# Patient Record
Sex: Female | Born: 1940 | Race: White | Hispanic: No | Marital: Married | Smoking: Never smoker
Health system: Southern US, Community
[De-identification: ages and names within clinical notes are randomized; demographics above are authoritative.]

## PROBLEM LIST (undated history)

## (undated) DIAGNOSIS — E119 Type 2 diabetes mellitus without complications: Secondary | ICD-10-CM

## (undated) DIAGNOSIS — E7800 Pure hypercholesterolemia, unspecified: Secondary | ICD-10-CM

## (undated) DIAGNOSIS — I10 Essential (primary) hypertension: Secondary | ICD-10-CM

## (undated) HISTORY — PX: BREAST BIOPSY: SHX20

---

## 1998-07-06 ENCOUNTER — Ambulatory Visit (HOSPITAL_COMMUNITY): Admission: RE | Admit: 1998-07-06 | Discharge: 1998-07-06 | Payer: Self-pay | Admitting: *Deleted

## 1998-07-06 ENCOUNTER — Encounter: Payer: Self-pay | Admitting: *Deleted

## 1998-09-13 ENCOUNTER — Ambulatory Visit (HOSPITAL_COMMUNITY): Admission: RE | Admit: 1998-09-13 | Discharge: 1998-09-13 | Payer: Self-pay | Admitting: Obstetrics & Gynecology

## 1999-06-26 ENCOUNTER — Encounter: Payer: Self-pay | Admitting: Emergency Medicine

## 1999-06-26 ENCOUNTER — Emergency Department (HOSPITAL_COMMUNITY): Admission: EM | Admit: 1999-06-26 | Discharge: 1999-06-26 | Payer: Self-pay | Admitting: Emergency Medicine

## 1999-06-30 ENCOUNTER — Encounter: Payer: Self-pay | Admitting: Emergency Medicine

## 1999-06-30 ENCOUNTER — Ambulatory Visit (HOSPITAL_COMMUNITY): Admission: RE | Admit: 1999-06-30 | Discharge: 1999-06-30 | Payer: Self-pay | Admitting: Emergency Medicine

## 1999-08-04 ENCOUNTER — Ambulatory Visit (HOSPITAL_COMMUNITY): Admission: RE | Admit: 1999-08-04 | Discharge: 1999-08-04 | Payer: Self-pay | Admitting: Emergency Medicine

## 1999-08-04 ENCOUNTER — Encounter: Payer: Self-pay | Admitting: Emergency Medicine

## 1999-10-09 ENCOUNTER — Ambulatory Visit (HOSPITAL_BASED_OUTPATIENT_CLINIC_OR_DEPARTMENT_OTHER): Admission: RE | Admit: 1999-10-09 | Discharge: 1999-10-09 | Payer: Self-pay | Admitting: Emergency Medicine

## 1999-11-14 ENCOUNTER — Encounter: Admission: RE | Admit: 1999-11-14 | Discharge: 2000-02-12 | Payer: Self-pay | Admitting: Pulmonary Disease

## 1999-12-31 ENCOUNTER — Ambulatory Visit (HOSPITAL_BASED_OUTPATIENT_CLINIC_OR_DEPARTMENT_OTHER): Admission: RE | Admit: 1999-12-31 | Discharge: 1999-12-31 | Payer: Self-pay | Admitting: Pulmonary Disease

## 2000-09-26 ENCOUNTER — Encounter: Payer: Self-pay | Admitting: Emergency Medicine

## 2000-09-26 ENCOUNTER — Ambulatory Visit (HOSPITAL_COMMUNITY): Admission: RE | Admit: 2000-09-26 | Discharge: 2000-09-26 | Payer: Self-pay | Admitting: Emergency Medicine

## 2000-10-01 ENCOUNTER — Encounter: Payer: Self-pay | Admitting: Emergency Medicine

## 2000-10-01 ENCOUNTER — Encounter: Admission: RE | Admit: 2000-10-01 | Discharge: 2000-10-01 | Payer: Self-pay | Admitting: Emergency Medicine

## 2001-04-18 ENCOUNTER — Encounter: Admission: RE | Admit: 2001-04-18 | Discharge: 2001-04-18 | Payer: Self-pay | Admitting: Emergency Medicine

## 2001-04-18 ENCOUNTER — Encounter: Payer: Self-pay | Admitting: Emergency Medicine

## 2001-05-20 ENCOUNTER — Encounter: Admission: RE | Admit: 2001-05-20 | Discharge: 2001-08-18 | Payer: Self-pay | Admitting: Emergency Medicine

## 2001-10-03 ENCOUNTER — Ambulatory Visit (HOSPITAL_COMMUNITY): Admission: RE | Admit: 2001-10-03 | Discharge: 2001-10-03 | Payer: Self-pay | Admitting: Emergency Medicine

## 2001-10-03 ENCOUNTER — Encounter: Payer: Self-pay | Admitting: Emergency Medicine

## 2002-06-11 ENCOUNTER — Encounter: Admission: RE | Admit: 2002-06-11 | Discharge: 2002-06-11 | Payer: Self-pay | Admitting: Emergency Medicine

## 2002-06-11 ENCOUNTER — Encounter: Payer: Self-pay | Admitting: Emergency Medicine

## 2003-04-02 ENCOUNTER — Other Ambulatory Visit: Payer: Self-pay

## 2003-10-09 ENCOUNTER — Encounter: Admission: RE | Admit: 2003-10-09 | Discharge: 2003-10-09 | Payer: Self-pay | Admitting: Emergency Medicine

## 2003-11-20 ENCOUNTER — Ambulatory Visit (HOSPITAL_COMMUNITY): Admission: RE | Admit: 2003-11-20 | Discharge: 2003-11-20 | Payer: Self-pay | Admitting: Emergency Medicine

## 2005-02-24 ENCOUNTER — Ambulatory Visit (HOSPITAL_COMMUNITY): Admission: RE | Admit: 2005-02-24 | Discharge: 2005-02-24 | Payer: Self-pay | Admitting: Gynecology

## 2006-01-16 ENCOUNTER — Ambulatory Visit: Payer: Self-pay | Admitting: Gastroenterology

## 2006-02-27 ENCOUNTER — Ambulatory Visit (HOSPITAL_COMMUNITY): Admission: RE | Admit: 2006-02-27 | Discharge: 2006-02-27 | Payer: Self-pay | Admitting: Family Medicine

## 2007-05-07 ENCOUNTER — Ambulatory Visit (HOSPITAL_COMMUNITY): Admission: RE | Admit: 2007-05-07 | Discharge: 2007-05-07 | Payer: Self-pay | Admitting: Family Medicine

## 2008-03-26 ENCOUNTER — Ambulatory Visit: Payer: Self-pay | Admitting: Family Medicine

## 2008-03-27 ENCOUNTER — Ambulatory Visit: Payer: Self-pay | Admitting: Family Medicine

## 2008-03-27 ENCOUNTER — Inpatient Hospital Stay: Payer: Self-pay | Admitting: Internal Medicine

## 2008-04-14 ENCOUNTER — Ambulatory Visit: Payer: Self-pay | Admitting: General Surgery

## 2008-07-27 ENCOUNTER — Ambulatory Visit (HOSPITAL_COMMUNITY): Admission: RE | Admit: 2008-07-27 | Discharge: 2008-07-27 | Payer: Self-pay | Admitting: Family Medicine

## 2009-03-26 ENCOUNTER — Ambulatory Visit: Payer: Self-pay | Admitting: Family Medicine

## 2009-10-04 IMAGING — CR DG CHEST 1V PORT
1 series · 1 of 1 positions shown · non-contrast
Comparison: none

REASON FOR EXAM: pt on vent, s/p decortication of left pleural effusion
COMMENTS:

[view not recorded]
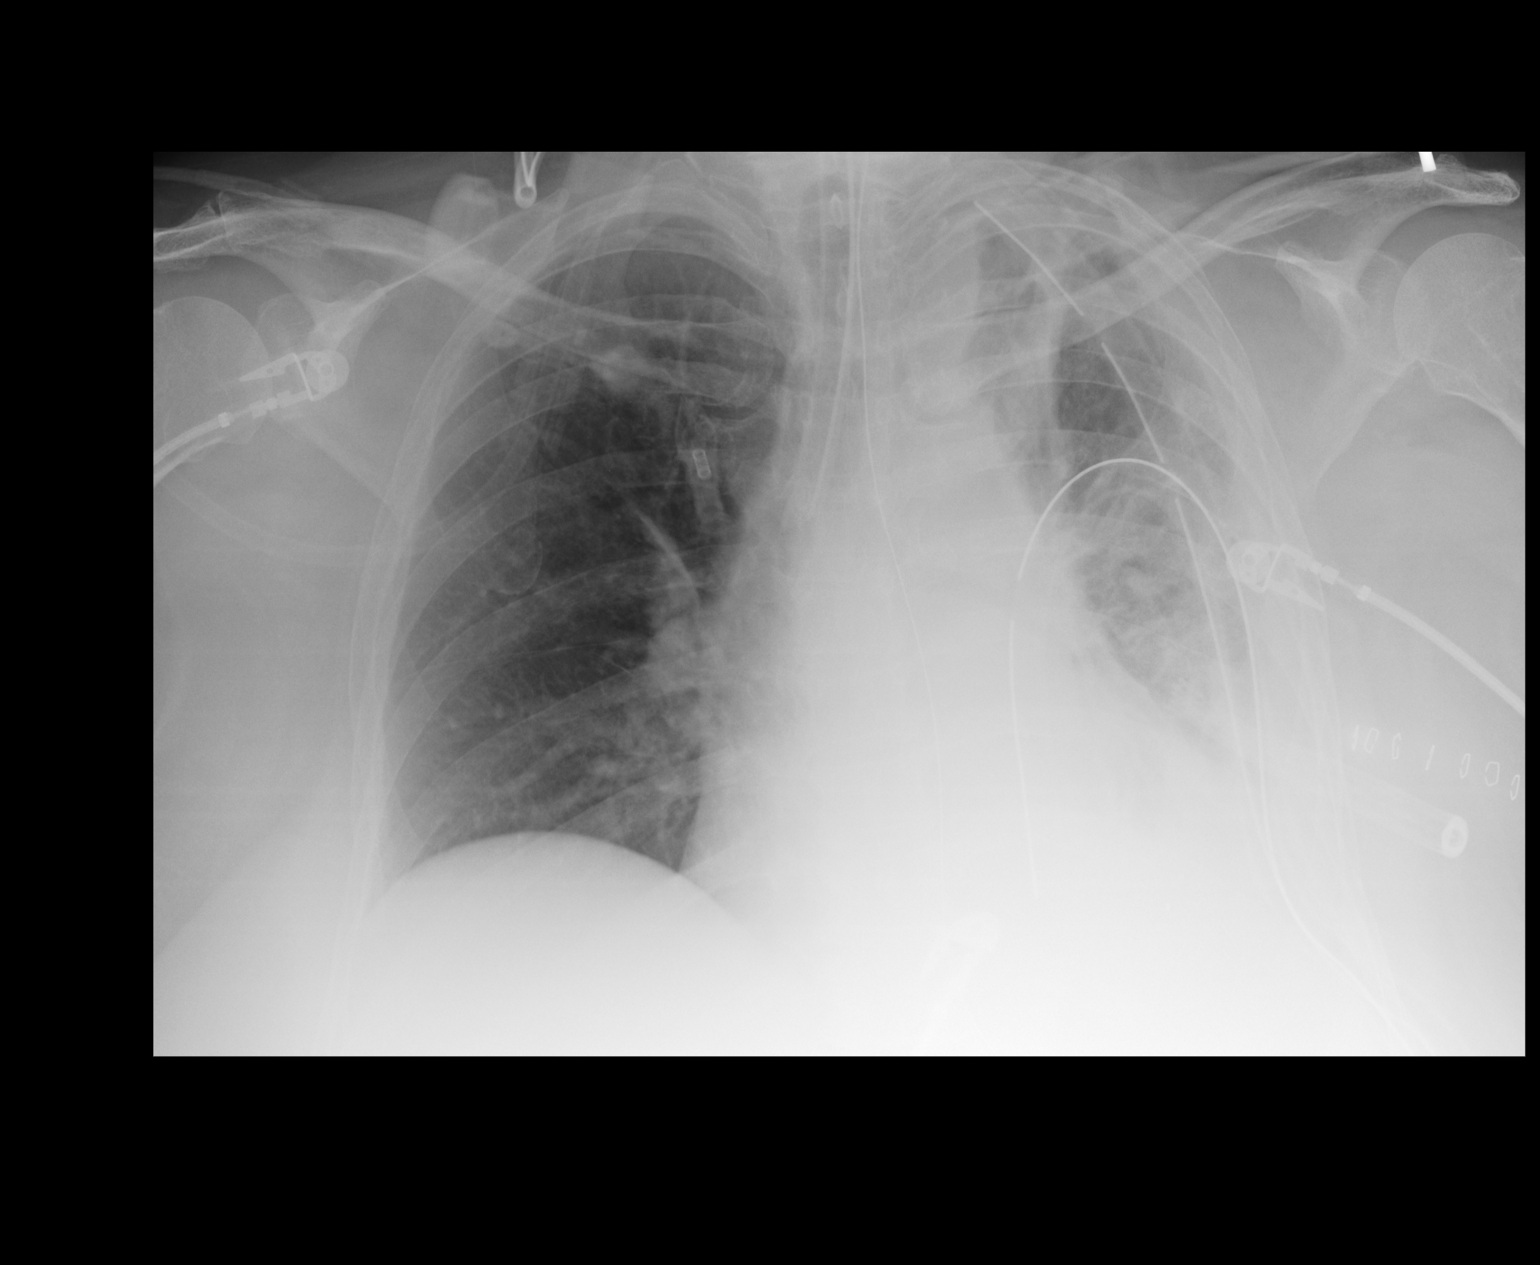

[1 of 1 positions shown; findings below may reference images not displayed]

PROCEDURE:     DXR - DXR PORTABLE CHEST SINGLE VIEW  - April 02, 2008  [DATE]

RESULT:     Comparison is made to the prior exam if 04/01/08. Two chest tubes
are present on the LEFT. No pneumothorax is seen. Mild atelectasis or
fibrosis is noted at the LEFT upper lobe. There is hazy increase in density
throughout the RIGHT lung field, consistent with postsurgical change,
pleural thickening and possibly residual effusion at the LEFT base. The
RIGHT lung field is clear of infiltrate. An endotracheal tube is present
with the tip at the orifice of the RIGHT mainstem bronchus. The endotracheal
tube needs to be withdrawn by a few centimeters. A nasogastric tube is
present.
IMPRESSION: Please see above.

## 2009-10-06 IMAGING — CR DG CHEST 1V PORT
1 series · 1 of 1 positions shown · non-contrast
Comparison: none

REASON FOR EXAM: pleural effusion
COMMENTS:

[view not recorded]
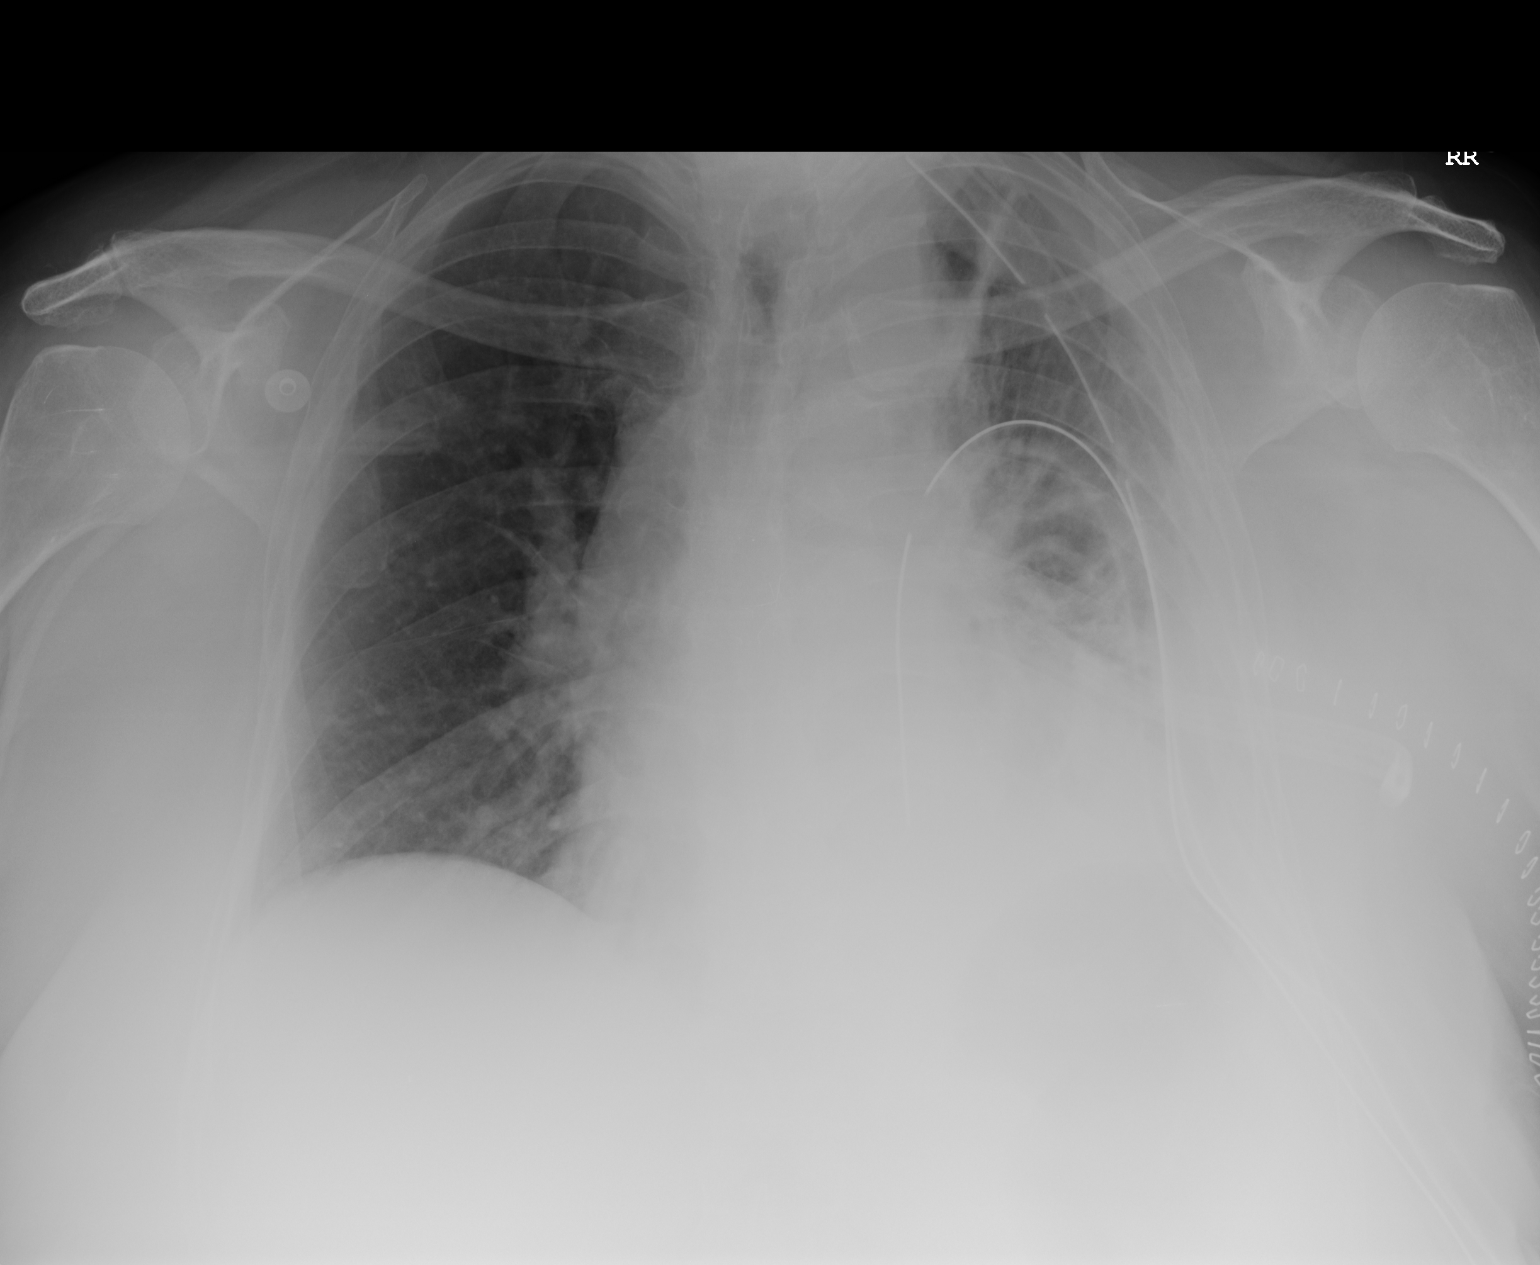

[1 of 1 positions shown; findings below may reference images not displayed]

PROCEDURE:     DXR - DXR PORTABLE CHEST SINGLE VIEW  - April 04, 2008 [DATE]

RESULT:     Two chest tubes are noted on the LEFT. There is no evidence of
pneumothorax.  Better aeration of the LEFT lung is noted from prior day's
exam. Residual pleural effusion and/or atelectasis/infiltrate is noted. The
RIGHT lung is clear.
IMPRESSION: 1. Two chest tubes noted in good anatomic position. Better aeration of the
LEFT lung is noted on today's examination. This suggests removal of pleural
effusion. Residual effusion and/or atelectasis/pneumonia in the LEFT lung
base is present.

2. No other abnormalities are identified. The heart size is prominent. No
evidence of congestive heart failure.

## 2009-10-09 IMAGING — CR DG CHEST 2V
1 series · 2 of 2 positions shown · non-contrast
Comparison: none

REASON FOR EXAM: S/P decortication left lung.
COMMENTS:

[Series 1: view not recorded · 0.17mm/px · 2 of 2 slices shown]
[im 1/2]
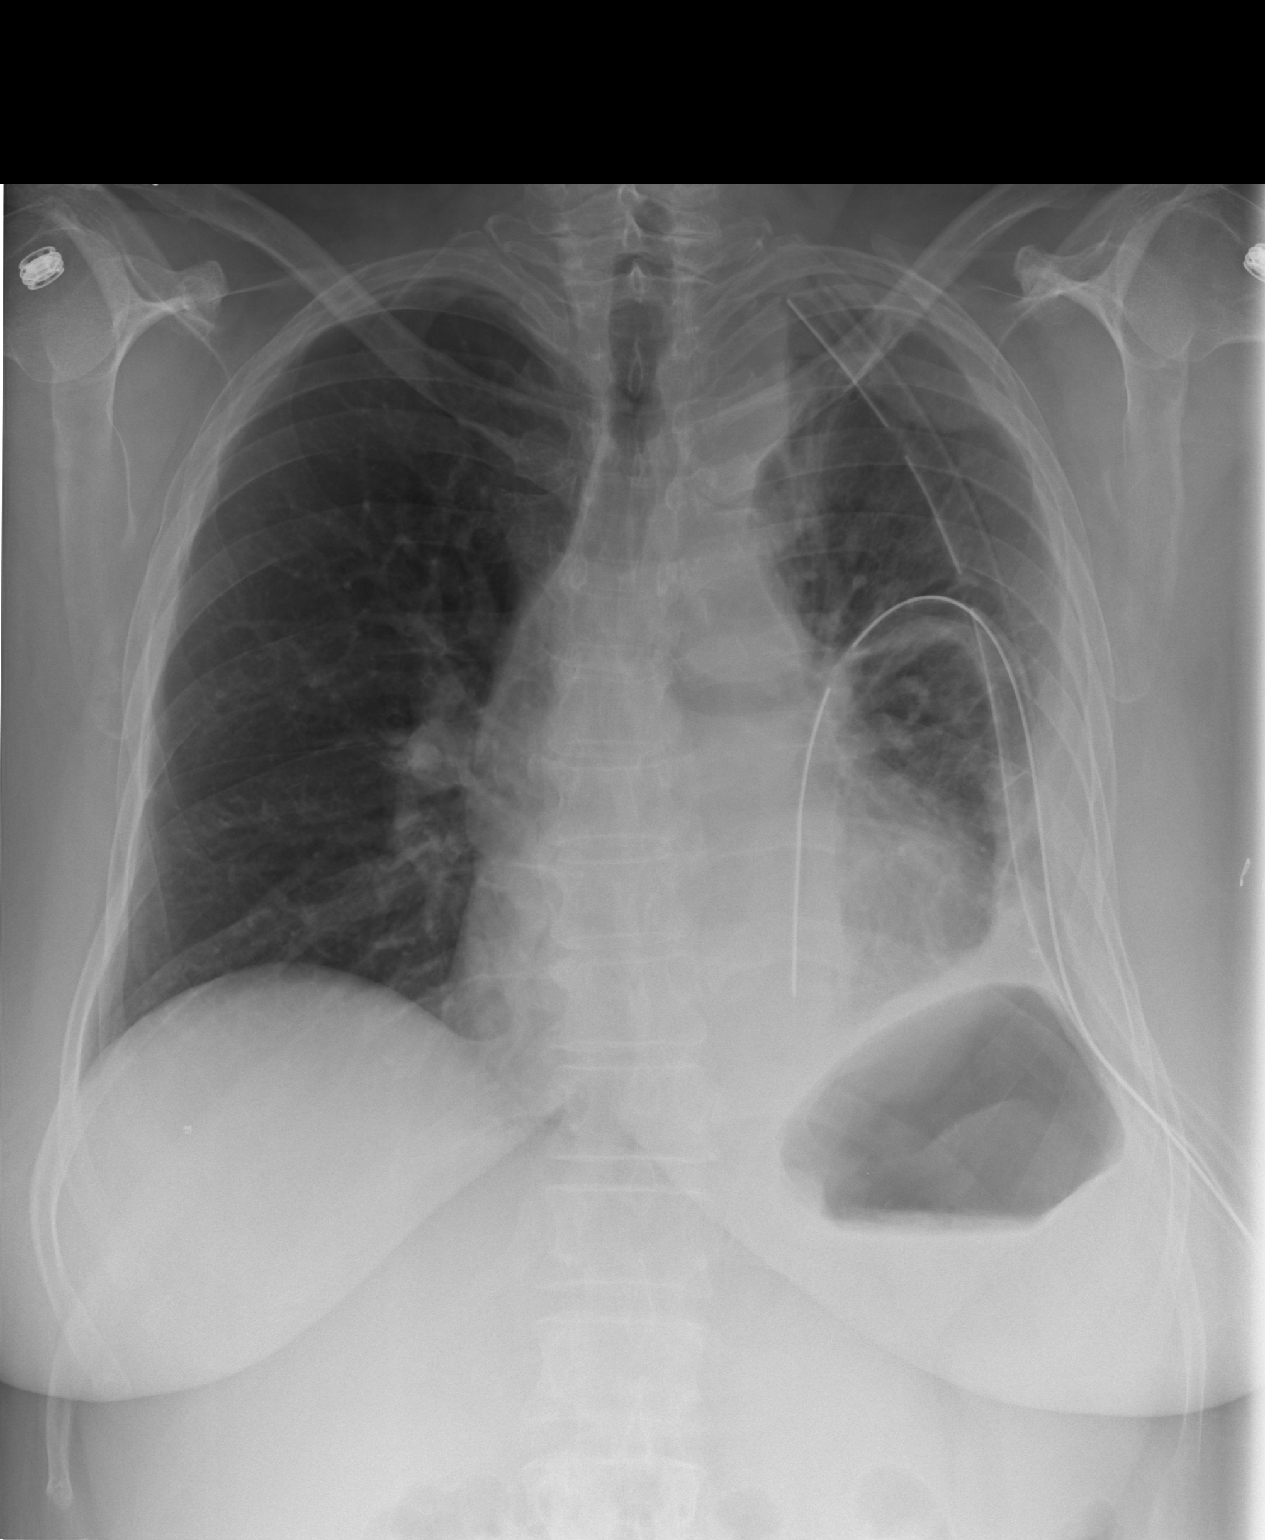
[im 2/2]
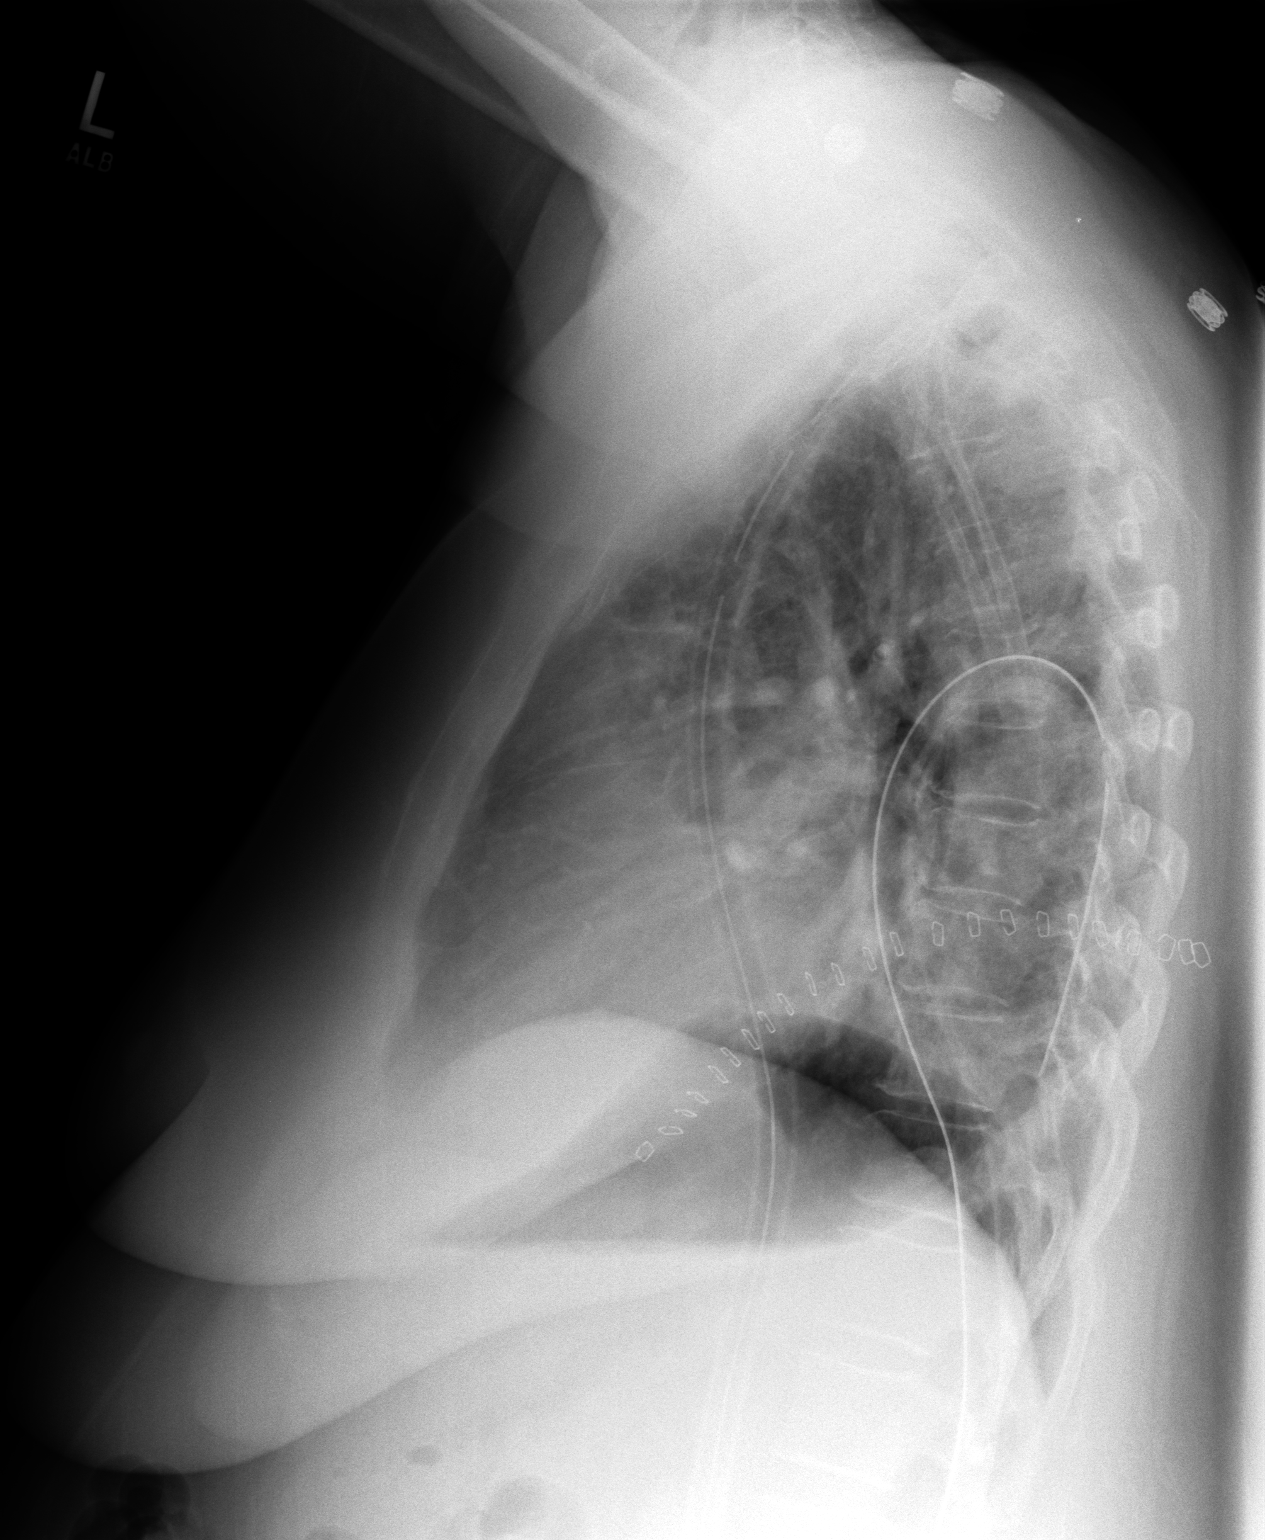

[2 of 2 positions shown; findings below may reference images not displayed]

PROCEDURE:     DXR - DXR CHEST PA (OR AP) AND LATERAL  - April 07, 2008  [DATE]

RESULT:     Comparison is made to study 04 April, 2008.

The two chest tubes remain in place on the LEFT. The LEFT lung is better
aerated today. There remains some density at the LEFT lung base laterally
consistent with fluid or pleural thickening. Pleural thickening in the LEFT
paratracheal region is present and stable. The cardiac silhouette is mildly
enlarged though stable. The RIGHT lung is clear.
IMPRESSION: There is improved appearance of the LEFT lung since the
prior study.

## 2009-10-10 IMAGING — CR DG CHEST 2V
1 series · 2 of 2 positions shown · non-contrast
Comparison: none

REASON FOR EXAM: Effusion
COMMENTS:

[Series 1: view not recorded · 0.17mm/px · 2 of 2 slices shown]
[im 1/2]
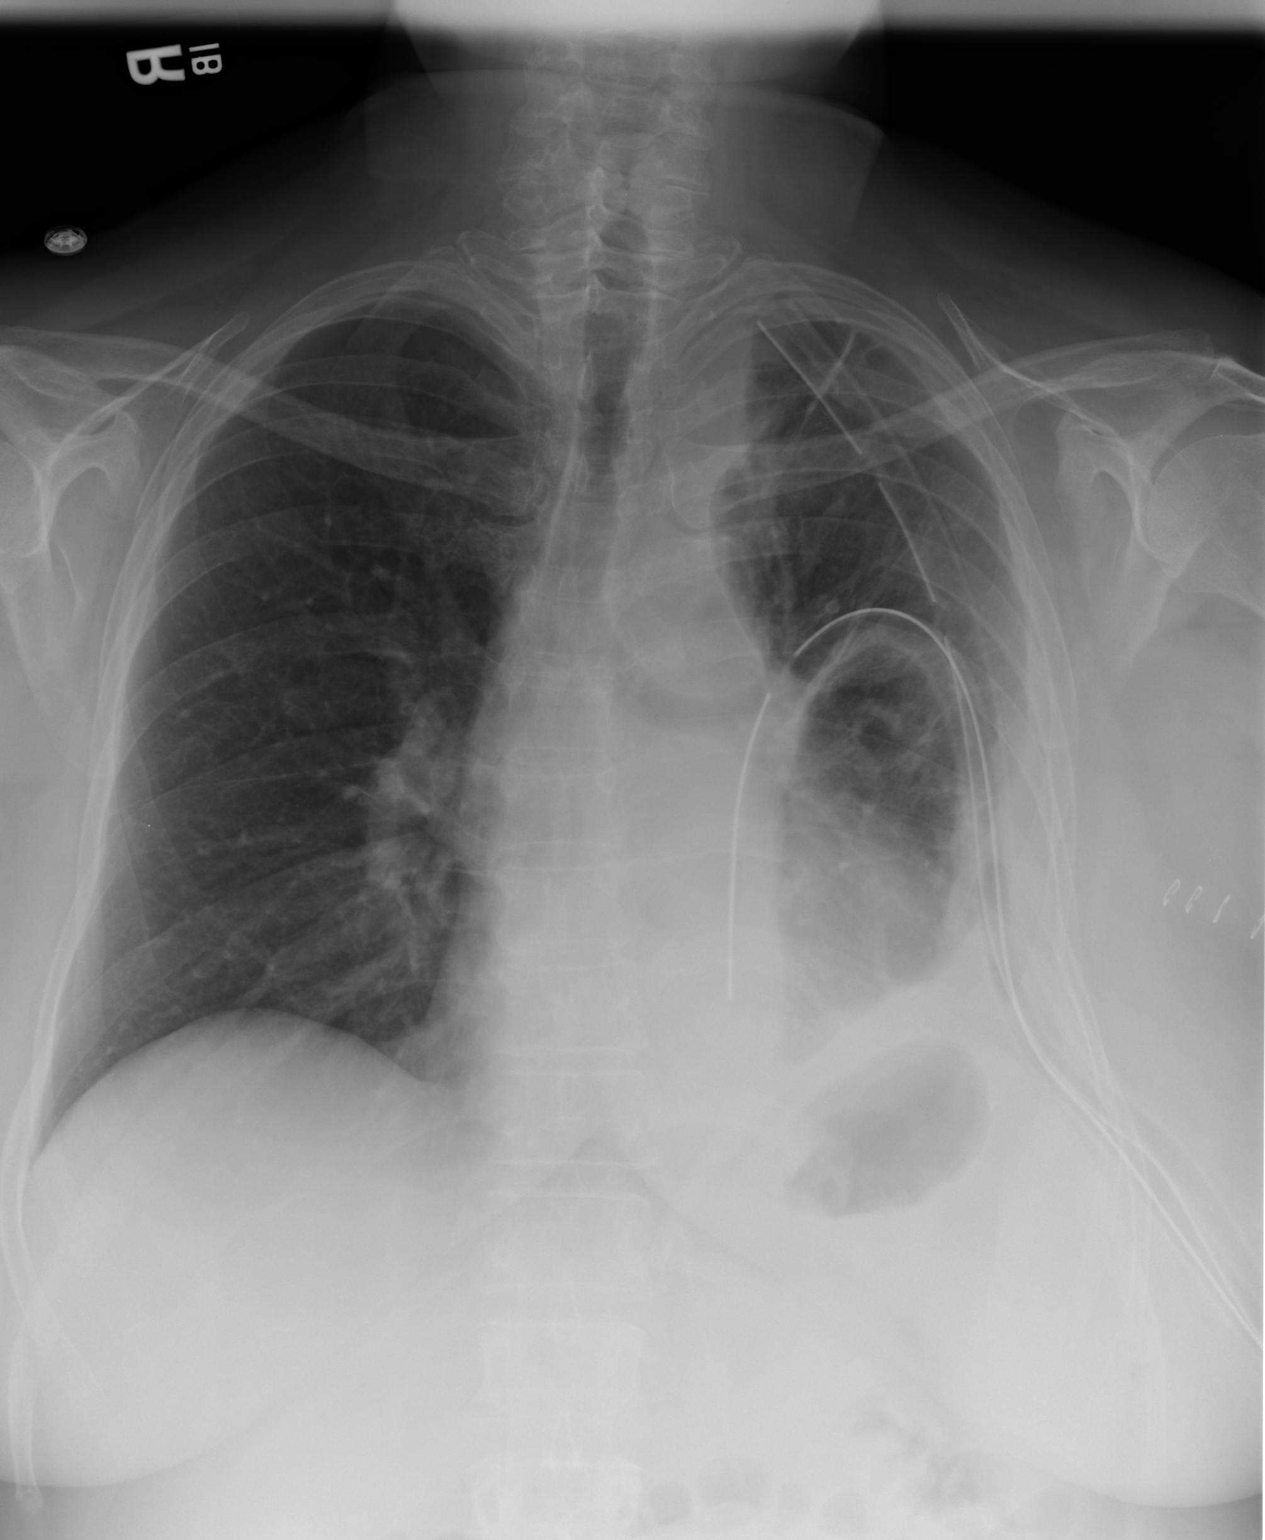
[im 2/2]
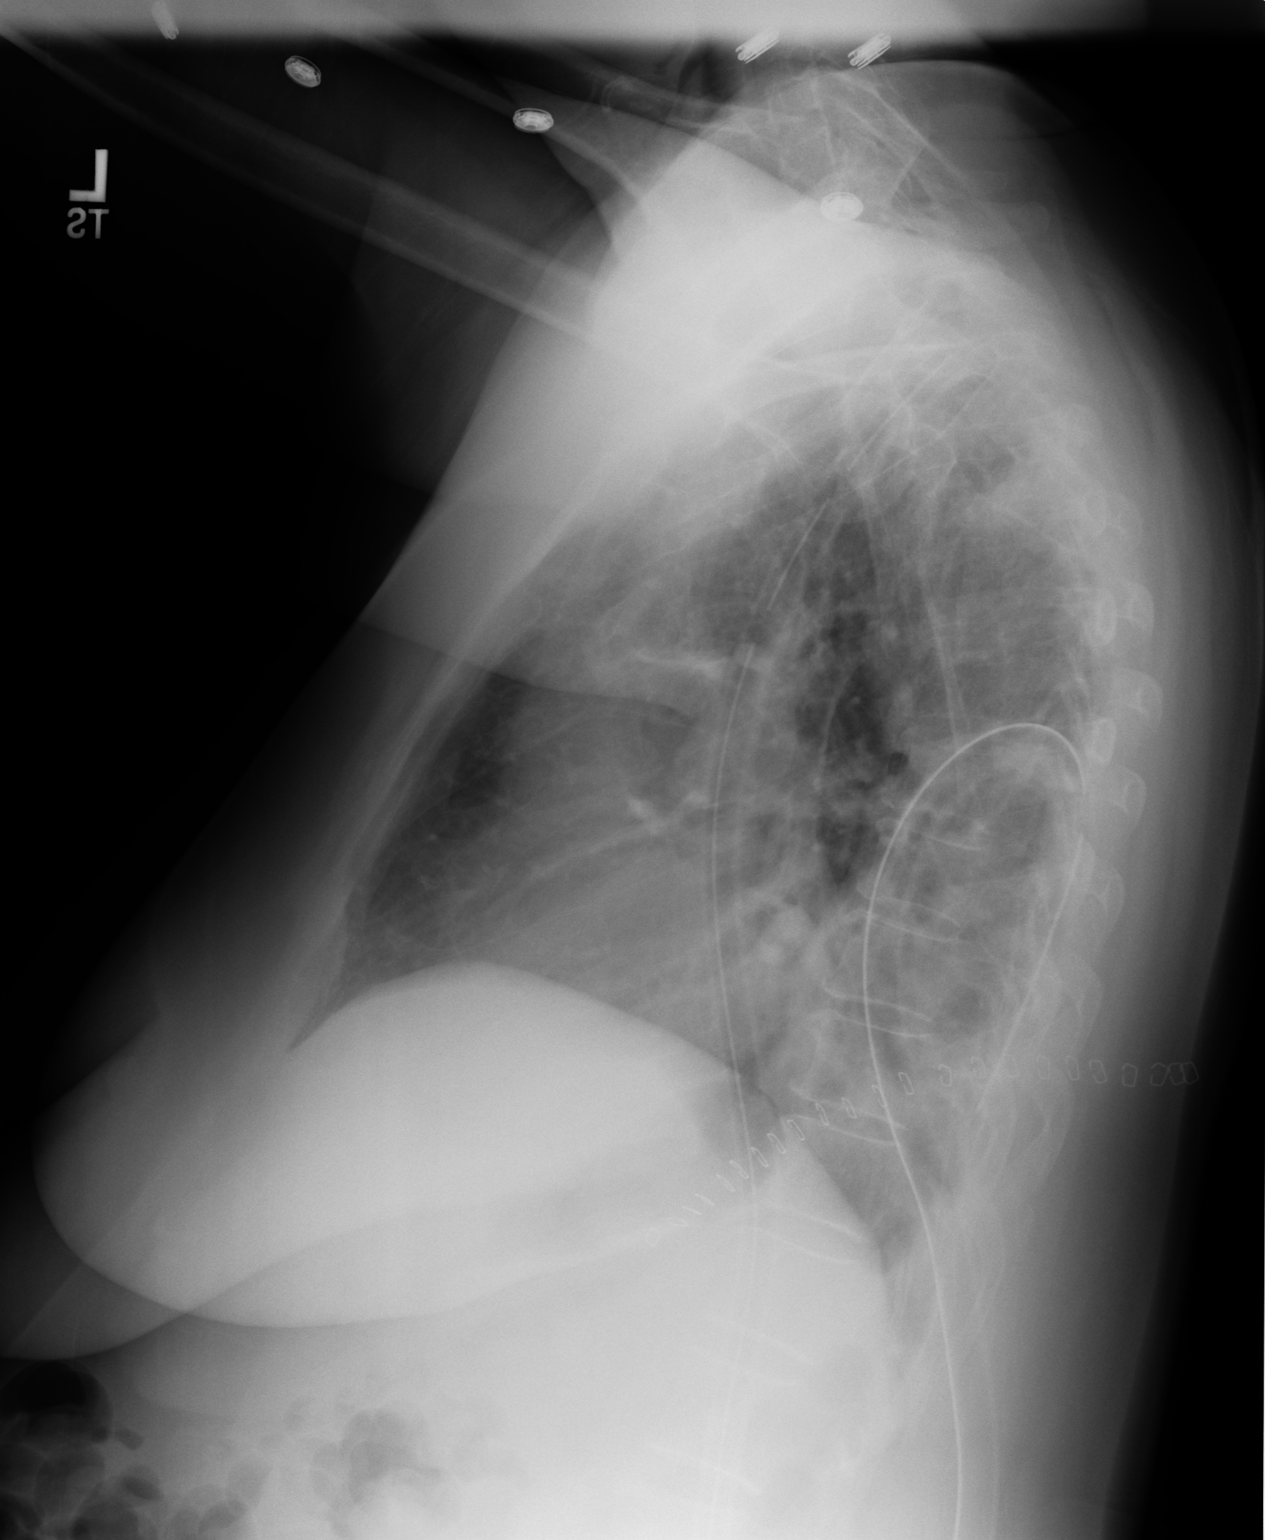

[2 of 2 positions shown; findings below may reference images not displayed]

PROCEDURE:     DXR - DXR CHEST PA (OR AP) AND LATERAL  - April 08, 2008  [DATE]

RESULT:     Comparison is made to the prior study dated 04/07/2008.

Chest tube is appreciated within the left hemithorax within the apex and
projected along the lower left base along the pericardial region. There is
an area of increased density projecting along the left lung base tracking
along the wall which has the appearance of a possible loculated effusion.
There is thickening of the interstitial markings. No further focal regions
of consolidation are appreciated. The cardiac silhouette is within the upper
limits of normal and the visualized bony skeleton demonstrates no evidence
of fracture or dislocation.
IMPRESSION: 1.     Chest tubes within the left hemithorax as described above without
evidence of an appreciable pneumothorax.
2.     Pleural thickening versus possible loculated effusion within the left
lung base.
3.     Pulmonary vascular congestion with possible element of interstitial
disease particularly within the left hemithorax.

## 2009-10-11 IMAGING — CR DG CHEST 2V
1 series · 2 of 2 positions shown · non-contrast
Comparison: none

REASON FOR EXAM: Status post chest tube removal.
COMMENTS:

[Series 1: view not recorded · 0.17mm/px · 2 of 2 slices shown]
[im 1/2]
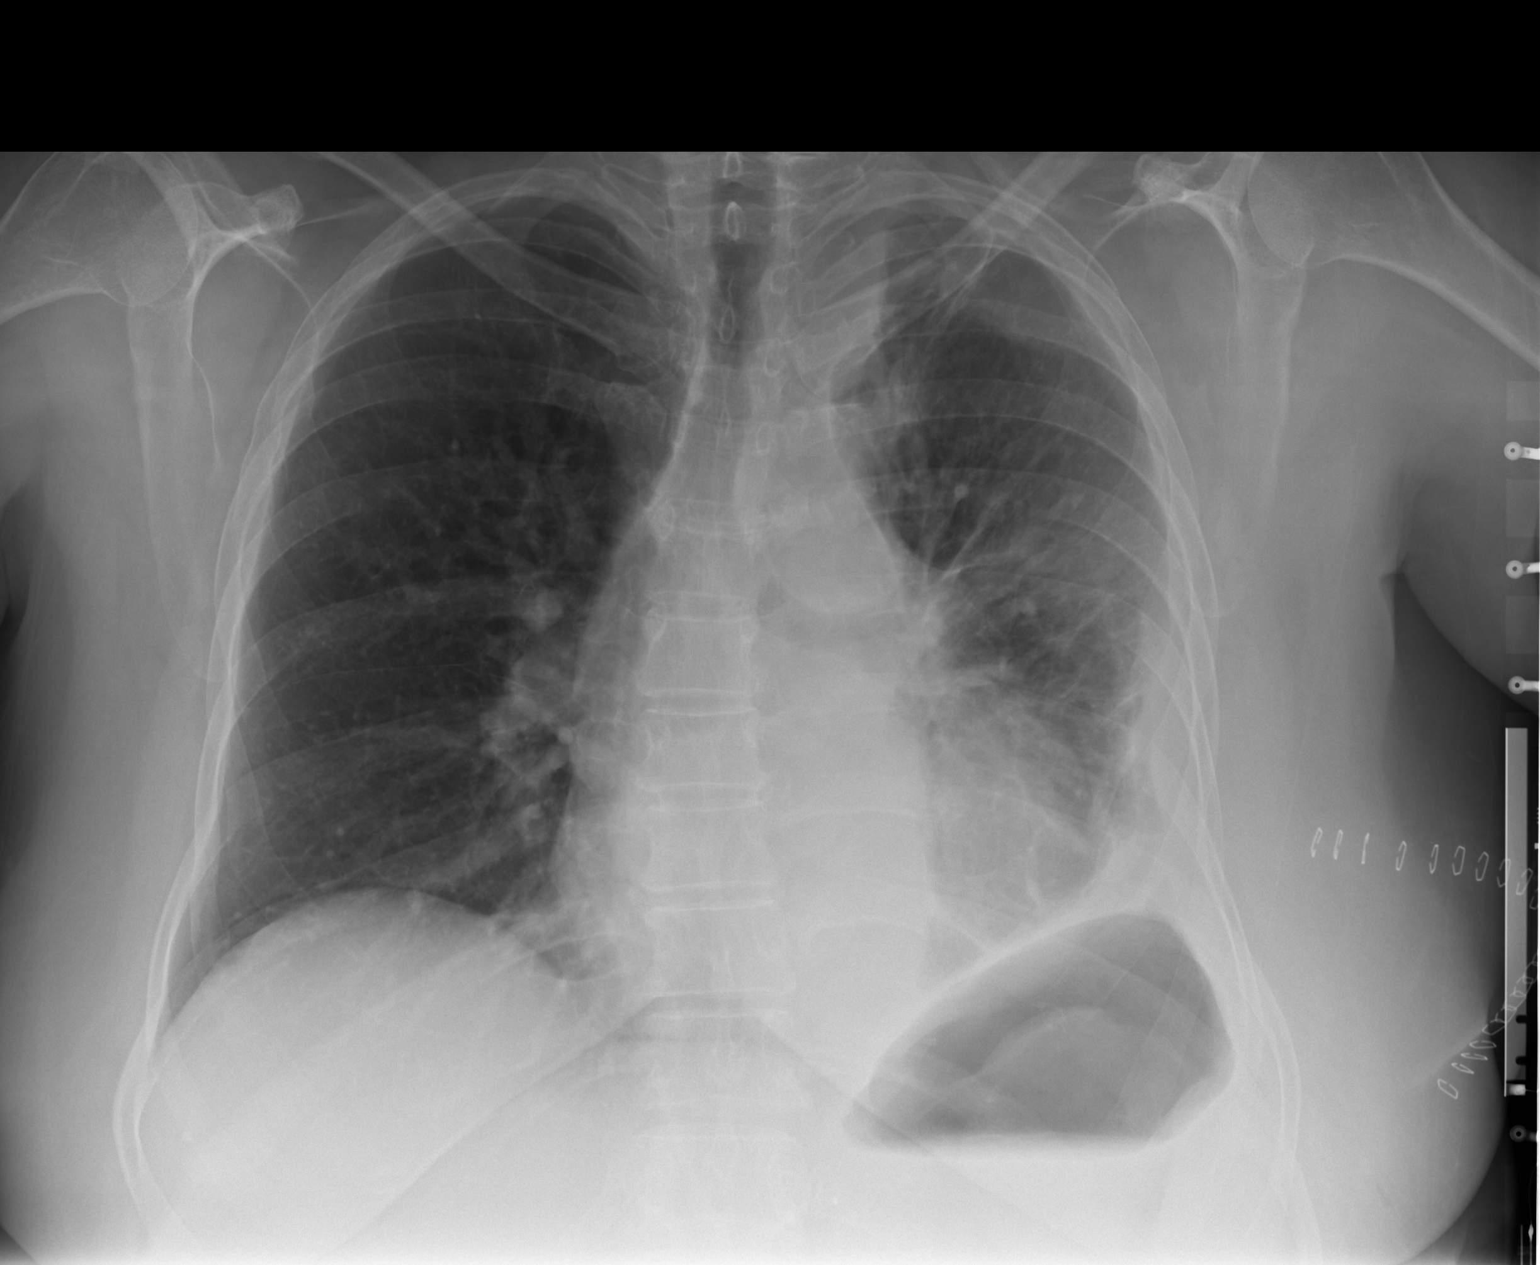
[im 2/2]
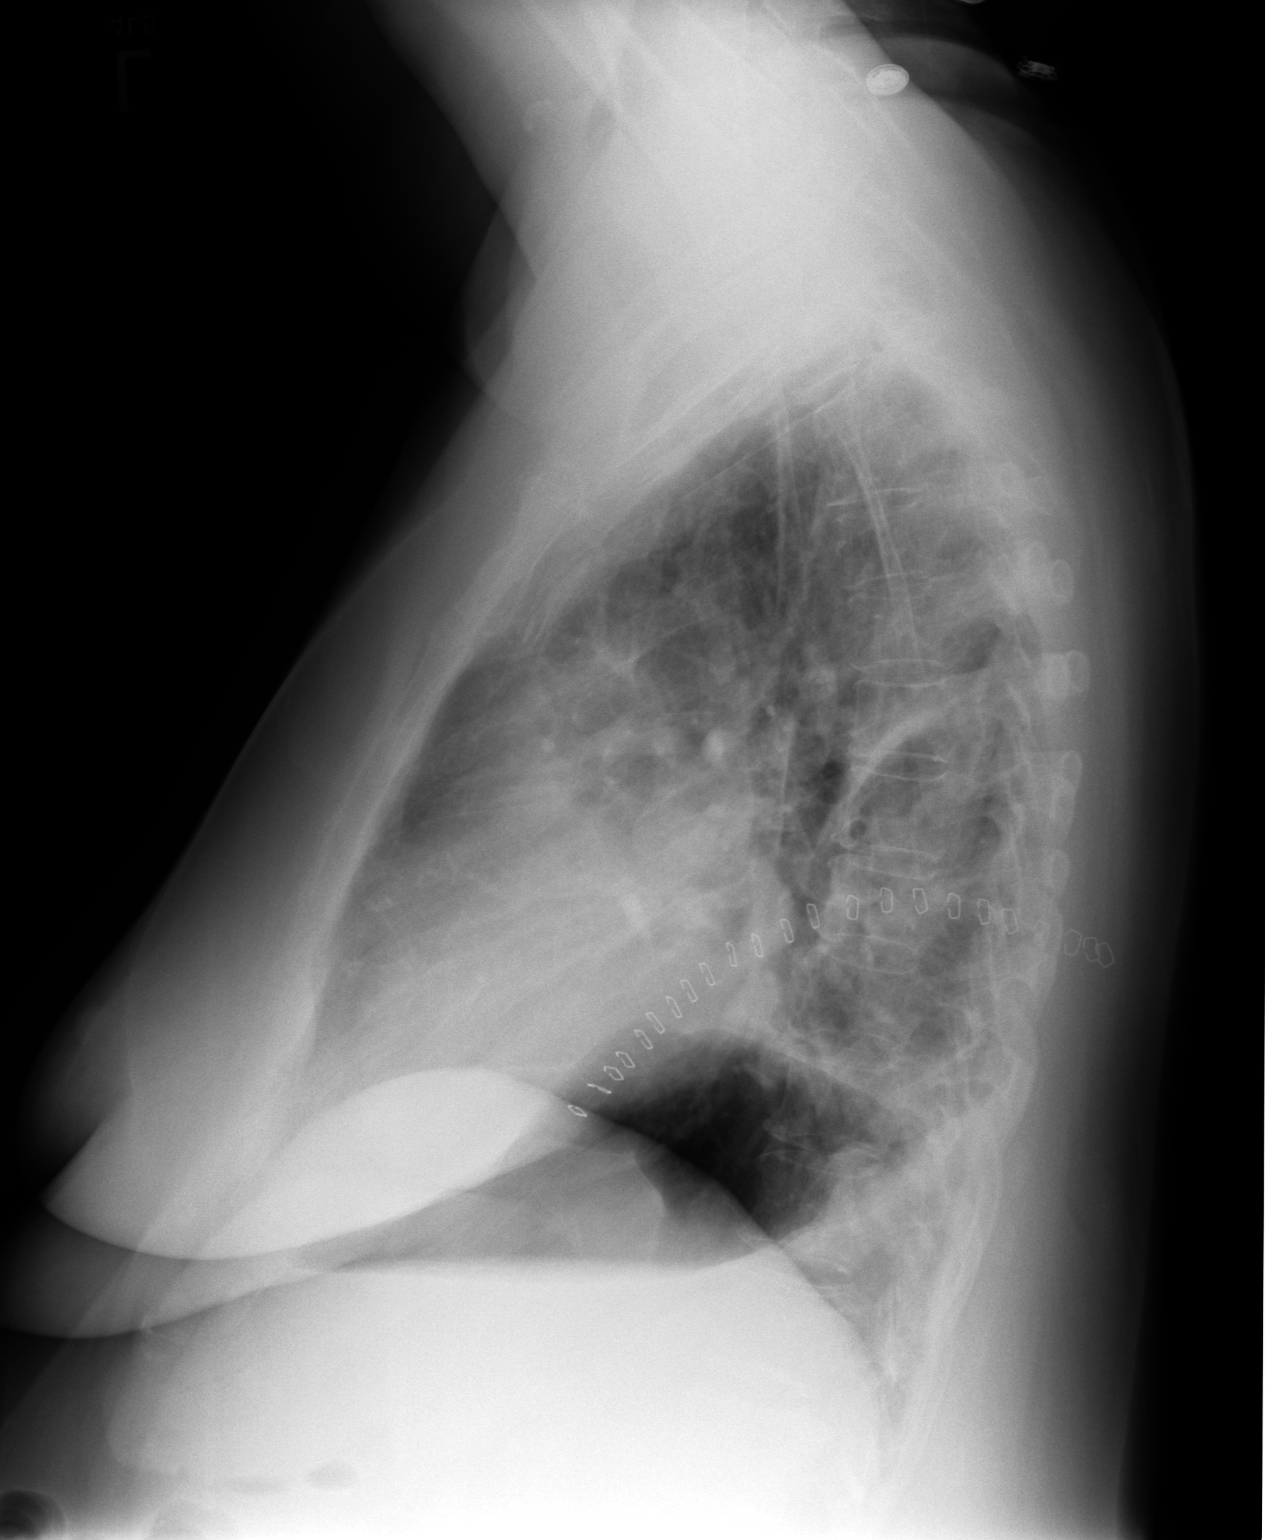

[2 of 2 positions shown; findings below may reference images not displayed]

PROCEDURE:     DXR - DXR CHEST PA (OR AP) AND LATERAL  - April 09, 2008 [DATE]

RESULT:     Comparison is made to previous chest x-rays of 04/07/2008 and
04/08/2008.

The left chest tubes have been removed. There is pleural thickening along
the paramediastinal portion of the left lung apex and along the superior
portion of the left lung apex without significant pneumothorax. There is no
mediastinal shift. Stable density is seen in the costophrenic margin which
could be fluid or pleural thickening.
IMPRESSION: Interval removal of two chest tubes with no definite
pneumothorax. No other significant change has occurred.

## 2010-01-05 ENCOUNTER — Ambulatory Visit (HOSPITAL_COMMUNITY): Admission: RE | Admit: 2010-01-05 | Discharge: 2010-01-05 | Payer: Self-pay | Admitting: Family Medicine

## 2010-12-13 ENCOUNTER — Other Ambulatory Visit (HOSPITAL_COMMUNITY): Payer: Self-pay | Admitting: Family Medicine

## 2010-12-13 DIAGNOSIS — Z1231 Encounter for screening mammogram for malignant neoplasm of breast: Secondary | ICD-10-CM

## 2010-12-13 DIAGNOSIS — Z13820 Encounter for screening for osteoporosis: Secondary | ICD-10-CM

## 2011-01-09 ENCOUNTER — Ambulatory Visit (HOSPITAL_COMMUNITY)
Admission: RE | Admit: 2011-01-09 | Discharge: 2011-01-09 | Disposition: A | Discharge status: Home / Self Care | Payer: Medicare Other | Source: Ambulatory Visit | Attending: Family Medicine | Admitting: Family Medicine

## 2011-01-09 DIAGNOSIS — Z1231 Encounter for screening mammogram for malignant neoplasm of breast: Secondary | ICD-10-CM | POA: Insufficient documentation

## 2011-01-09 DIAGNOSIS — Z13820 Encounter for screening for osteoporosis: Secondary | ICD-10-CM

## 2012-02-09 ENCOUNTER — Other Ambulatory Visit (HOSPITAL_COMMUNITY): Payer: Self-pay | Admitting: Family Medicine

## 2012-02-09 DIAGNOSIS — Z1231 Encounter for screening mammogram for malignant neoplasm of breast: Secondary | ICD-10-CM

## 2012-02-27 ENCOUNTER — Ambulatory Visit (HOSPITAL_COMMUNITY)
Admission: RE | Admit: 2012-02-27 | Discharge: 2012-02-27 | Disposition: A | Discharge status: Home / Self Care | Payer: Medicare Other | Source: Ambulatory Visit | Attending: Family Medicine | Admitting: Family Medicine

## 2012-02-27 DIAGNOSIS — Z1231 Encounter for screening mammogram for malignant neoplasm of breast: Secondary | ICD-10-CM | POA: Insufficient documentation

## 2013-02-26 ENCOUNTER — Other Ambulatory Visit (HOSPITAL_COMMUNITY): Payer: Self-pay | Admitting: Family Medicine

## 2013-02-26 DIAGNOSIS — Z1231 Encounter for screening mammogram for malignant neoplasm of breast: Secondary | ICD-10-CM

## 2013-03-25 ENCOUNTER — Ambulatory Visit (HOSPITAL_COMMUNITY): Payer: Medicare Other

## 2013-04-29 ENCOUNTER — Other Ambulatory Visit (HOSPITAL_COMMUNITY): Payer: Self-pay | Admitting: Family Medicine

## 2013-04-29 DIAGNOSIS — M810 Age-related osteoporosis without current pathological fracture: Secondary | ICD-10-CM

## 2013-05-08 ENCOUNTER — Ambulatory Visit (HOSPITAL_COMMUNITY): Admission: RE | Admit: 2013-05-08 | Payer: Medicare Other | Source: Ambulatory Visit

## 2013-05-08 ENCOUNTER — Ambulatory Visit (HOSPITAL_COMMUNITY): Payer: Medicare Other

## 2013-07-03 ENCOUNTER — Ambulatory Visit (HOSPITAL_COMMUNITY): Payer: Medicare Other

## 2013-08-13 ENCOUNTER — Ambulatory Visit (HOSPITAL_COMMUNITY): Payer: Medicare Other

## 2013-08-18 ENCOUNTER — Ambulatory Visit (HOSPITAL_COMMUNITY)
Admission: RE | Admit: 2013-08-18 | Discharge: 2013-08-18 | Disposition: A | Discharge status: Home / Self Care | Payer: Medicare HMO | Source: Ambulatory Visit | Attending: Family Medicine | Admitting: Family Medicine

## 2013-08-18 ENCOUNTER — Other Ambulatory Visit (HOSPITAL_COMMUNITY): Payer: Self-pay | Admitting: Family Medicine

## 2013-08-18 DIAGNOSIS — Z1231 Encounter for screening mammogram for malignant neoplasm of breast: Secondary | ICD-10-CM | POA: Insufficient documentation

## 2013-08-18 DIAGNOSIS — Z780 Asymptomatic menopausal state: Secondary | ICD-10-CM | POA: Insufficient documentation

## 2013-08-18 DIAGNOSIS — Z13820 Encounter for screening for osteoporosis: Secondary | ICD-10-CM | POA: Insufficient documentation

## 2013-08-20 ENCOUNTER — Other Ambulatory Visit: Payer: Self-pay | Admitting: Family Medicine

## 2013-08-20 DIAGNOSIS — R928 Other abnormal and inconclusive findings on diagnostic imaging of breast: Secondary | ICD-10-CM

## 2013-09-05 ENCOUNTER — Ambulatory Visit
Admission: RE | Admit: 2013-09-05 | Discharge: 2013-09-05 | Disposition: A | Discharge status: Home / Self Care | Payer: Commercial Managed Care - HMO | Source: Ambulatory Visit | Attending: Family Medicine | Admitting: Family Medicine

## 2013-09-05 ENCOUNTER — Other Ambulatory Visit: Payer: Self-pay | Admitting: Family Medicine

## 2013-09-05 DIAGNOSIS — R921 Mammographic calcification found on diagnostic imaging of breast: Secondary | ICD-10-CM

## 2013-09-05 DIAGNOSIS — R928 Other abnormal and inconclusive findings on diagnostic imaging of breast: Secondary | ICD-10-CM

## 2013-09-12 ENCOUNTER — Ambulatory Visit
Admission: RE | Admit: 2013-09-12 | Discharge: 2013-09-12 | Disposition: A | Discharge status: Home / Self Care | Payer: Commercial Managed Care - HMO | Source: Ambulatory Visit | Attending: Family Medicine | Admitting: Family Medicine

## 2013-09-12 DIAGNOSIS — R921 Mammographic calcification found on diagnostic imaging of breast: Secondary | ICD-10-CM

## 2013-09-12 DIAGNOSIS — R928 Other abnormal and inconclusive findings on diagnostic imaging of breast: Secondary | ICD-10-CM

## 2013-12-22 ENCOUNTER — Encounter (HOSPITAL_COMMUNITY): Payer: Self-pay | Admitting: Emergency Medicine

## 2013-12-22 ENCOUNTER — Inpatient Hospital Stay (HOSPITAL_COMMUNITY)
Admission: EM | Admit: 2013-12-22 | Discharge: 2013-12-25 | DRG: 066 | Disposition: A | Discharge status: Skilled Nursing Facility | Payer: Medicare HMO | Source: Intra-hospital | Attending: Internal Medicine | Admitting: Internal Medicine

## 2013-12-22 ENCOUNTER — Emergency Department (HOSPITAL_COMMUNITY): Payer: Medicare HMO

## 2013-12-22 DIAGNOSIS — R29898 Other symptoms and signs involving the musculoskeletal system: Secondary | ICD-10-CM | POA: Diagnosis present

## 2013-12-22 DIAGNOSIS — E119 Type 2 diabetes mellitus without complications: Secondary | ICD-10-CM | POA: Diagnosis present

## 2013-12-22 DIAGNOSIS — E785 Hyperlipidemia, unspecified: Secondary | ICD-10-CM | POA: Diagnosis present

## 2013-12-22 DIAGNOSIS — I10 Essential (primary) hypertension: Secondary | ICD-10-CM | POA: Diagnosis present

## 2013-12-22 DIAGNOSIS — I6350 Cerebral infarction due to unspecified occlusion or stenosis of unspecified cerebral artery: Secondary | ICD-10-CM | POA: Diagnosis not present

## 2013-12-22 DIAGNOSIS — Z7982 Long term (current) use of aspirin: Secondary | ICD-10-CM | POA: Diagnosis not present

## 2013-12-22 DIAGNOSIS — E669 Obesity, unspecified: Secondary | ICD-10-CM | POA: Diagnosis present

## 2013-12-22 DIAGNOSIS — I6789 Other cerebrovascular disease: Secondary | ICD-10-CM | POA: Diagnosis present

## 2013-12-22 DIAGNOSIS — Z79899 Other long term (current) drug therapy: Secondary | ICD-10-CM

## 2013-12-22 DIAGNOSIS — I63011 Cerebral infarction due to thrombosis of right vertebral artery: Secondary | ICD-10-CM

## 2013-12-22 DIAGNOSIS — Z6839 Body mass index (BMI) 39.0-39.9, adult: Secondary | ICD-10-CM

## 2013-12-22 DIAGNOSIS — R279 Unspecified lack of coordination: Secondary | ICD-10-CM | POA: Diagnosis present

## 2013-12-22 DIAGNOSIS — Z930 Tracheostomy status: Secondary | ICD-10-CM | POA: Diagnosis not present

## 2013-12-22 DIAGNOSIS — E118 Type 2 diabetes mellitus with unspecified complications: Secondary | ICD-10-CM

## 2013-12-22 DIAGNOSIS — I639 Cerebral infarction, unspecified: Secondary | ICD-10-CM | POA: Diagnosis present

## 2013-12-22 HISTORY — DX: Type 2 diabetes mellitus without complications: E11.9

## 2013-12-22 HISTORY — DX: Pure hypercholesterolemia, unspecified: E78.00

## 2013-12-22 HISTORY — DX: Essential (primary) hypertension: I10

## 2013-12-22 LAB — URINALYSIS, ROUTINE W REFLEX MICROSCOPIC
Bilirubin Urine: NEGATIVE
Glucose, UA: 100 mg/dL — AB
Hgb urine dipstick: NEGATIVE
Ketones, ur: NEGATIVE mg/dL
Nitrite: NEGATIVE
Protein, ur: NEGATIVE mg/dL
Specific Gravity, Urine: 1.008 (ref 1.005–1.030)
Urobilinogen, UA: 0.2 mg/dL (ref 0.0–1.0)
pH: 7 (ref 5.0–8.0)

## 2013-12-22 LAB — CBC
HCT: 41.1 % (ref 36.0–46.0)
HEMOGLOBIN: 14.1 g/dL (ref 12.0–15.0)
MCH: 29.4 pg (ref 26.0–34.0)
MCHC: 34.3 g/dL (ref 30.0–36.0)
MCV: 85.6 fL (ref 78.0–100.0)
Platelets: 279 10*3/uL (ref 150–400)
RBC: 4.8 MIL/uL (ref 3.87–5.11)
RDW: 12.4 % (ref 11.5–15.5)
WBC: 8.5 10*3/uL (ref 4.0–10.5)

## 2013-12-22 LAB — I-STAT TROPONIN, ED: Troponin i, poc: 0 ng/mL (ref 0.00–0.08)

## 2013-12-22 LAB — URINE MICROSCOPIC-ADD ON

## 2013-12-22 LAB — COMPREHENSIVE METABOLIC PANEL
ALK PHOS: 83 U/L (ref 39–117)
ALT: 13 U/L (ref 0–35)
ANION GAP: 14 (ref 5–15)
AST: 20 U/L (ref 0–37)
Albumin: 3.8 g/dL (ref 3.5–5.2)
BILIRUBIN TOTAL: 0.4 mg/dL (ref 0.3–1.2)
BUN: 14 mg/dL (ref 6–23)
CHLORIDE: 95 meq/L — AB (ref 96–112)
CO2: 25 mEq/L (ref 19–32)
Calcium: 9.3 mg/dL (ref 8.4–10.5)
Creatinine, Ser: 0.44 mg/dL — ABNORMAL LOW (ref 0.50–1.10)
GLUCOSE: 303 mg/dL — AB (ref 70–99)
Potassium: 4.4 mEq/L (ref 3.7–5.3)
Sodium: 134 mEq/L — ABNORMAL LOW (ref 137–147)
TOTAL PROTEIN: 7 g/dL (ref 6.0–8.3)

## 2013-12-22 LAB — DIFFERENTIAL
Basophils Absolute: 0 10*3/uL (ref 0.0–0.1)
Basophils Relative: 0 % (ref 0–1)
EOS ABS: 0.2 10*3/uL (ref 0.0–0.7)
Eosinophils Relative: 2 % (ref 0–5)
LYMPHS PCT: 22 % (ref 12–46)
Lymphs Abs: 1.8 10*3/uL (ref 0.7–4.0)
MONOS PCT: 4 % (ref 3–12)
Monocytes Absolute: 0.4 10*3/uL (ref 0.1–1.0)
Neutro Abs: 6.2 10*3/uL (ref 1.7–7.7)
Neutrophils Relative %: 72 % (ref 43–77)

## 2013-12-22 LAB — CBG MONITORING, ED: Glucose-Capillary: 275 mg/dL — ABNORMAL HIGH (ref 70–99)

## 2013-12-22 LAB — PROTIME-INR
INR: 1.03 (ref 0.00–1.49)
Prothrombin Time: 13.5 seconds (ref 11.6–15.2)

## 2013-12-22 LAB — APTT: aPTT: 25 seconds (ref 24–37)

## 2013-12-22 LAB — GLUCOSE, CAPILLARY: GLUCOSE-CAPILLARY: 253 mg/dL — AB (ref 70–99)

## 2013-12-22 MED ORDER — ENOXAPARIN SODIUM 40 MG/0.4ML ~~LOC~~ SOLN
40.0000 mg | SUBCUTANEOUS | Status: DC
  Administered 2013-12-22 – 2013-12-24 (×3): 40 mg via SUBCUTANEOUS
  Filled 2013-12-22 (×4): qty 0.4

## 2013-12-22 MED ORDER — SENNOSIDES-DOCUSATE SODIUM 8.6-50 MG PO TABS: 1.0000 | ORAL_TABLET | Freq: Every evening | ORAL | Status: DC | PRN

## 2013-12-22 MED ORDER — INSULIN ASPART 100 UNIT/ML ~~LOC~~ SOLN
0.0000 [IU] | Freq: Every day | SUBCUTANEOUS | Status: DC
  Administered 2013-12-22 – 2013-12-23 (×2): 3 [IU] via SUBCUTANEOUS
  Administered 2013-12-24: 2 [IU] via SUBCUTANEOUS

## 2013-12-22 MED ORDER — STROKE: EARLY STAGES OF RECOVERY BOOK
Freq: Once | Status: AC
  Administered 2013-12-22: 22:00:00
  Filled 2013-12-22: qty 1

## 2013-12-22 MED ORDER — INSULIN ASPART 100 UNIT/ML ~~LOC~~ SOLN
0.0000 [IU] | Freq: Three times a day (TID) | SUBCUTANEOUS | Status: DC
  Administered 2013-12-23: 3 [IU] via SUBCUTANEOUS
  Administered 2013-12-23: 7 [IU] via SUBCUTANEOUS

## 2013-12-22 NOTE — ED Provider Notes (Signed)
Medical screening examination/treatment/procedure(s) were conducted as a shared visit with non-physician practitioner(s) or resident  and myself.  I personally evaluated the patient during the encounter and agree with the findings.   I have personally reviewed any xrays and/ or EKG's with the provider and I agree with interpretation.   Patient called code stroke prior to arrival, patient examined on arrival and protecting airway. Neurology evaluated and assisted immediately. Patient has right lower extremity weakness, mild slurred speech. On exam patient has mild dysmetria with finger-nose in the right, subtle general weakness worse right lower extremity. Mild dry mucous membranes, abdomen soft nontender. Pupils equal bilateral, eczema the muscle function intact, no facial droop appreciated. Neurology concern for posterior stroke and recommended medicine admission.  Acute stroke  Mariea Clonts, MD 12/23/13 0001

## 2013-12-22 NOTE — Progress Notes (Signed)
Attempted to call ED for report. No answer. Will try again in 15 minutes. Elspeth Cho, RN

## 2013-12-22 NOTE — Consult Note (Signed)
Referring Physician: Reather Converse    Chief Complaint: Right leg weakness  HPI: Carrie Moses is an 73 y.o. female who reports waking up normal today.  Was able to make breakfast and get her grandson off to school.  She sat down in her rocking chair at about 0830 and eventually took a nap in the chair.  When she awakened she went to go to the bathroom and could not make her right leg work.  Later in the day her son wa made awake.  They called her PCP who referred her to the ED.  EMS was called and the patient as brought to the ED as a code stroke.  Initial NIHSS of 5.    Date last known well: Date: 12/22/2013 Time last known well: Time: 08:30 tPA Given: No: Outside time window  Past Medical History  Diagnosis Date  . Diabetes mellitus without complication   . Hypertension   . Hypercholesteremia     Past surgical history: Right total knee replacement  Family history: Her father died of a cerebral aneurysm.  Her mother died of cancer.  Social History:  reports that she has never smoked. She does not have any smokeless tobacco history on file. She reports that she does not drink alcohol or use illicit drugs.  Allergies: No Known Allergies  Medications: I have reviewed the patient's current medications. Prior to Admission:  ASA, Glipizide  ROS: History obtained from the patient  General ROS: negative for - chills, fatigue, fever, night sweats, weight gain or weight loss Psychological ROS: negative for - behavioral disorder, hallucinations, memory difficulties, mood swings or suicidal ideation Ophthalmic ROS: negative for - blurry vision, double vision, eye pain or loss of vision ENT ROS: negative for - epistaxis, nasal discharge, oral lesions, sore throat, tinnitus or vertigo Allergy and Immunology ROS: negative for - hives or itchy/watery eyes Hematological and Lymphatic ROS: negative for - bleeding problems, bruising or swollen lymph nodes Endocrine ROS: negative for - galactorrhea, hair  pattern changes, polydipsia/polyuria or temperature intolerance Respiratory ROS: negative for - cough, hemoptysis, shortness of breath or wheezing Cardiovascular ROS: negative for - chest pain, dyspnea on exertion, edema or irregular heartbeat Gastrointestinal ROS: negative for - abdominal pain, diarrhea, hematemesis, nausea/vomiting or stool incontinence Genito-Urinary ROS: negative for - dysuria, hematuria, incontinence or urinary frequency/urgency Musculoskeletal ROS: negative for - joint swelling or muscular weakness Neurological ROS: as noted in HPI Dermatological ROS: negative for rash and skin lesion changes  Physical Examination: Blood pressure 140/71, pulse 95, temperature 98.5 F (36.9 C), temperature source Oral, resp. rate 12, weight 97.523 kg (215 lb), SpO2 96.00%.  Neurologic Examination: Mental Status: Alert, oriented, thought content appropriate.  Speech fluent without evidence of aphasia with some mild dysarthria.  Able to follow 3 step commands without difficulty. Cranial Nerves: II: Discs flat bilaterally; Visual fields grossly normal, pupils equal, round, reactive to light and accommodation III,IV, VI: ptosis not present, extra-ocular motions intact bilaterally V,VII: decreased right NLF, facial light touch sensation normal bilaterally VIII: hearing normal bilaterally IX,X: gag reflex present XI: bilateral shoulder shrug XII: midline tongue extension Motor: Right : Upper extremity   5/5    Left:     Upper extremity   5/5  Lower extremity   5-/5     Lower extremity   5/5 Tone and bulk:normal tone throughout; no atrophy noted Sensory: Pinprick and light touch decreased on the RUE Deep Tendon Reflexes: 2+ and symmetric in the upper extremities, trace at the knees and absent  at the ankles Plantars: Right: downgoing   Left: downgoing Cerebellar: normal finger-to-nose and normal heel-to-shin testing on the left.  Mild dysmetria with finger-to-nose testing on the right  and marked dysmetria with right hell-to-shin testing Gait: Unable to test safely CV: pulses palpable throughout     Laboratory Studies:  Basic Metabolic Panel:  Recent Labs Lab 12/22/13 1748  NA 134*  K 4.4  CL 95*  CO2 25  GLUCOSE 303*  BUN 14  CREATININE 0.44*  CALCIUM 9.3    Liver Function Tests:  Recent Labs Lab 12/22/13 1748  AST 20  ALT 13  ALKPHOS 83  BILITOT 0.4  PROT 7.0  ALBUMIN 3.8   No results found for this basename: LIPASE, AMYLASE,  in the last 168 hours No results found for this basename: AMMONIA,  in the last 168 hours  CBC:  Recent Labs Lab 12/22/13 1748  WBC 8.5  NEUTROABS 6.2  HGB 14.1  HCT 41.1  MCV 85.6  PLT 279    Cardiac Enzymes: No results found for this basename: CKTOTAL, CKMB, CKMBINDEX, TROPONINI,  in the last 168 hours  BNP: No components found with this basename: POCBNP,   CBG:  Recent Labs Lab 12/22/13 1807  GLUCAP 275*    Microbiology: No results found for this or any previous visit.  Coagulation Studies:  Recent Labs  12/22/13 1748  LABPROT 13.5  INR 1.03    Urinalysis: No results found for this basename: COLORURINE, APPERANCEUR, LABSPEC, PHURINE, GLUCOSEU, HGBUR, BILIRUBINUR, KETONESUR, PROTEINUR, UROBILINOGEN, NITRITE, LEUKOCYTESUR,  in the last 168 hours  Lipid Panel: No results found for this basename: chol,  trig,  hdl,  cholhdl,  vldl,  ldlcalc    HgbA1C:  No results found for this basename: HGBA1C    Urine Drug Screen:   No results found for this basename: labopia,  cocainscrnur,  labbenz,  amphetmu,  thcu,  labbarb    Alcohol Level: No results found for this basename: ETH,  in the last 168 hours  Other results: EKG: sinus rhythm at 94 bpm.  Imaging: Ct Head (brain) Wo Contrast  12/22/2013   ADDENDUM REPORT: 12/22/2013 18:08  ADDENDUM: Critical Value/emergent results were called by telephone at the time of interpretation on 12/22/2013 at 6:07 pm to Dr. Doy Mince, who verbally  acknowledged these results.   Electronically Signed   By: Skipper Cliche M.D.   On: 12/22/2013 18:08   12/22/2013   CLINICAL DATA:  Code stroke, right-sided weakness  EXAM: CT HEAD WITHOUT CONTRAST  TECHNIQUE: Contiguous axial images were obtained from the base of the skull through the vertex without intravenous contrast.  COMPARISON:  None.  FINDINGS: Mild age-related atrophy. No evidence of vascular territory infarct, mass, hemorrhage, or extra-axial fluid. No hydrocephalus. Prominent perivascular spaces in the medial temporal lobes bilaterally, normal variant. Calvarium intact.  IMPRESSION: No acute finding  Electronically Signed: By: Skipper Cliche M.D. On: 12/22/2013 18:04    Assessment: 73 y.o. female presenting with left dysmetria and mild sensory deficits.  Head CT reviewed and shows no acute changes.  Patient outside time window for tPA.  NIHSS not within a range that would be an indication for intervention.  Acute posterior circulation event suspected.  Patient on ASA at home.  Further work up recommended.    Stroke Risk Factors - diabetes mellitus, hyperlipidemia and hypertension  Plan: 1. HgbA1c, fasting lipid panel 2. MRI, MRA  of the brain without contrast 3. PT consult, OT consult, Speech consult 4. Echocardiogram 5. Carotid  dopplers 6. Prophylactic therapy-Antiplatelet med: Aspirin - dose 325mg  daily 7. Risk factor modification 8. Telemetry monitoring 9. Frequent neuro checks  Case discussed with Dr. Theodosia Quay, MD [facility] [phone] 12/22/2013, 6:39 PM

## 2013-12-22 NOTE — ED Provider Notes (Signed)
CSN: [id number]     Arrival date & time 12/22/13  1749 History   First MD Initiated Contact with Patient 12/22/13 1753     Chief Complaint  Patient presents with  . Code Stroke     (Consider location/radiation/quality/duration/timing/severity/associated sxs/prior Treatment) HPI Comments: Carrie Moses 73 y.o. Presents by EMS for sudden onset RLE weakness described as "dragging her foot". Started at 11:20 today. Was associated with slurred speech but has since resolved.   Patient is a 73 y.o. female presenting with neurologic complaint. The history is provided by the patient.  Neurologic Problem This is a new problem. The current episode started today (11:30 am). The problem occurs constantly. The problem has been unchanged. Associated symptoms include weakness (RLE). Pertinent negatives include no abdominal pain, anorexia, arthralgias, change in bowel habit, congestion, coughing, diaphoresis, fatigue, fever, headaches, joint swelling, myalgias, neck pain, numbness, swollen glands, urinary symptoms, vertigo, visual change or vomiting. Nothing aggravates the symptoms. She has tried nothing for the symptoms. The treatment provided no relief.    Past Medical History  Diagnosis Date  . Diabetes mellitus without complication   . Hypertension   . Hypercholesteremia    History reviewed. No pertinent past surgical history. History reviewed. No pertinent family history. History  Substance Use Topics  . Smoking status: Never Smoker   . Smokeless tobacco: Not on file  . Alcohol Use: No   OB History   Grav Para Term Preterm Abortions TAB SAB Ect Mult Living                 Review of Systems  Constitutional: Negative for fever, diaphoresis and fatigue.  HENT: Negative for congestion.   Respiratory: Negative for cough.   Gastrointestinal: Negative for vomiting, abdominal pain, anorexia and change in bowel habit.  Musculoskeletal: Negative for arthralgias, joint swelling, myalgias and neck  pain.  Neurological: Positive for weakness (RLE). Negative for vertigo, numbness and headaches.  All other systems reviewed and are negative.     Allergies  Other  Home Medications   Prior to Admission medications   Medication Sig Start Date End Date Taking? Authorizing Provider  aspirin EC 81 MG tablet Take 81 mg by mouth daily.   Yes Historical Provider, MD  fenofibrate 160 MG tablet Take 160 mg by mouth daily.   Yes Historical Provider, MD  glipiZIDE-metformin (METAGLIP) 5-500 MG per tablet Take 2 tablets by mouth 2 (two) times daily before a meal.   Yes Historical Provider, MD  losartan (COZAAR) 50 MG tablet Take 50 mg by mouth daily.   Yes Historical Provider, MD   BP 161/54  Pulse 88  Temp(Src) 98.5 F (36.9 C) (Oral)  Resp 20  Wt 215 lb (97.523 kg)  SpO2 96% Physical Exam  Constitutional: She is oriented to person, place, and time. She appears well-developed and well-nourished. No distress.  HENT:  Head: Normocephalic and atraumatic.  Right Ear: External ear normal.  Left Ear: External ear normal.  Eyes: Conjunctivae and EOM are normal. Right eye exhibits no discharge. Left eye exhibits no discharge.  Neck: Normal range of motion. Neck supple. No JVD present.  Cardiovascular: Normal rate, regular rhythm and normal heart sounds.  Exam reveals no gallop and no friction rub.   No murmur heard. Pulmonary/Chest: Effort normal and breath sounds normal. No stridor. No respiratory distress. She has no wheezes. She has no rales. She exhibits no tenderness.  Abdominal: Soft. Bowel sounds are normal. She exhibits no distension. There is no tenderness. There is  no rebound and no guarding.  Musculoskeletal: Normal range of motion. She exhibits no edema.  Neurological: She is alert and oriented to person, place, and time. GCS eye subscore is 4. GCS verbal subscore is 5. GCS motor subscore is 6.  Strength in flexion and extension at the shoulders, elbows, wrists, hips, knee, and ankle  5/5 bilaterally EHL 5/5 bilaterally Grip strength 5/5 bilaterally Sensation over the dorsum of the hand over the 1st, second and 5th metacarpal, and the lateral forearm and lateral shoulder intact bilaterally.  Sensation over the medial and lateral malleolus, and over the 1st metatarsal intact bilaterally. Dysmetria on the right. Abn heel to shin on the right.   Skin: Skin is warm. No rash noted. She is not diaphoretic.  Psychiatric: She has a normal mood and affect. Her behavior is normal.    ED Course  Procedures (including critical care time) Labs Review Labs Reviewed  COMPREHENSIVE METABOLIC PANEL - Abnormal; Notable for the following:    Sodium 134 (*)    Chloride 95 (*)    Glucose, Bld 303 (*)    Creatinine, Ser 0.44 (*)    All other components within normal limits  GLUCOSE, CAPILLARY - Abnormal; Notable for the following:    Glucose-Capillary 253 (*)    All other components within normal limits  CBG MONITORING, ED - Abnormal; Notable for the following:    Glucose-Capillary 275 (*)    All other components within normal limits  PROTIME-INR  APTT  CBC  DIFFERENTIAL  HEMOGLOBIN A1C  LIPID PANEL  HEMOGLOBIN A1C  URINALYSIS, ROUTINE W REFLEX MICROSCOPIC  URINE MICROSCOPIC-ADD ON  I-STAT TROPOININ, ED    Imaging Review Ct Head (brain) Wo Contrast  12/22/2013   ADDENDUM REPORT: 12/22/2013 18:08  ADDENDUM: Critical Value/emergent results were called by telephone at the time of interpretation on 12/22/2013 at 6:07 pm to Dr. Doy Mince, who verbally acknowledged these results.   Electronically Signed   By: Skipper Cliche M.D.   On: 12/22/2013 18:08   12/22/2013   CLINICAL DATA:  Code stroke, right-sided weakness  EXAM: CT HEAD WITHOUT CONTRAST  TECHNIQUE: Contiguous axial images were obtained from the base of the skull through the vertex without intravenous contrast.  COMPARISON:  None.  FINDINGS: Mild age-related atrophy. No evidence of vascular territory infarct, mass,  hemorrhage, or extra-axial fluid. No hydrocephalus. Prominent perivascular spaces in the medial temporal lobes bilaterally, normal variant. Calvarium intact.  IMPRESSION: No acute finding  Electronically Signed: By: Skipper Cliche M.D. On: 12/22/2013 18:04     EKG Interpretation   Date/Time:  Monday December 22 2013 18:05:45 EDT Ventricular Rate:  94 PR Interval:  212 QRS Duration: 94 QT Interval:  365 QTC Calculation: 456 R Axis:   33 Text Interpretation:  Sinus rhythm Borderline prolonged PR interval  Consider left atrial enlargement Confirmed by ZAVITZ  MD, JOSHUA ([id number]) on  12/22/2013 6:10:09 PM      MDM   Final diagnoses:  Unspecified cerebral artery occlusion with cerebral infarction  Type 2 diabetes mellitus with complication  Essential hypertension  Hyperlipidemia    Pt presents as a code stroke. Concern for cerebellar stroke CVA but out of tpa window. Admit for risk factor stratification and CVA work up. She was AOx4 and GC15 on exam. Some cerebellar findings on exam as noted above. She was admitted without any events. Care discussed with my attending Dr. Reather Converse.     Kelby Aline, MD 12/22/13 [phone]

## 2013-12-22 NOTE — ED Notes (Signed)
Presents with right sided weakness and intermittent slurred speech began while sitting her chair at 11:30 after waking from a nap. Weakness and ataxia to right side. No facial droop. No arm drift. Pt unable to tolerate laying flat. CBG 275.

## 2013-12-22 NOTE — H&P (Signed)
[facility]                                                                                    Patient Demographics  Carrie Moses, is a 73 y.o. female  CSN: [id number]  MRN: [id number]  DOB - 1941-01-03  Admit Date - 12/22/2013  Outpatient Primary MD for the patient is No primary provider on file.   With History of -  Past Medical History  Diagnosis Date  . Diabetes mellitus without complication   . Hypertension   . Hypercholesteremia       History reviewed. No pertinent past surgical history.  in for   Chief Complaint  Patient presents with  . Code Stroke     HPI  Carrie Moses  is a 73 y.o. 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 The patient had a good morning and was functioning normal and then she went to a rocking chair and had a nap and woke up at 11:30 with right leg weakness and ataxia. Patient denies any history of trauma fever or palpitations. No history of blurring of vision or slurring of speech. No history of nausea or vomiting. Patient is on baby aspirin once daily at home. Patient denies any headache dizziness or loss of consciousness. Patient was noted to have dysmetria upon physical examination in the emergency room and a CT of the head was negative. Neurology was consulted and I was called to admit.    Review of Systems    In addition to the HPI above,  No Fever-chills, No Headache, No changes with Vision or hearing, No problems swallowing food or Liquids, No Chest pain, Cough or Shortness of Breath, No Abdominal pain, No Nausea or Vommitting, Bowel movements are regular, No Blood in stool or Urine, No dysuria, No new skin rashes or bruises, No new joints pains-aches,  No recent weight gain or loss, No polyuria, polydypsia or polyphagia, No significant Mental Stressors.  A full 10  point Review of Systems was done, except as stated above, all other Review of Systems were negative.   Social History History  Substance Use Topics  . Smoking status: Never Smoker   . Smokeless tobacco: Not on file  . Alcohol Use: No     Family History Significant for diabetes mellitus in her father who died from brain aneurysm and breast cancer in her mother  Prior to Admission medications   Medication Sig Start Date End Date Taking? Authorizing Provider  aspirin EC 81 MG tablet Take 81 mg by mouth daily.   Yes Historical Provider, MD  fenofibrate 160 MG tablet Take 160 mg by mouth daily.   Yes Historical Provider, MD  glipiZIDE-metformin (METAGLIP) 5-500 MG per tablet Take 2 tablets by mouth 2 (two) times daily before a meal.   Yes Historical Provider, MD  losartan (COZAAR) 50 MG tablet Take 50 mg by mouth daily.   Yes Historical Provider, MD    Allergies  Allergen Reactions  . Other Other (See Comments)    Bell peppers cause nausea and vomiting  Physical Exam  Vitals  Blood pressure 137/117, pulse 88, temperature 98.5 F (36.9 C), temperature source Oral, resp. rate 17, weight 97.523 kg (215 lb), SpO2 92.00%.   1. General elderly white American female, very pleasant, in no acute distress  2. Normal affect and insight, Not Suicidal or Homicidal, Awake Alert, Oriented X 3.  3. No F.N deficits, ALL C.Nerves Intact, Strength 5/5 all 4 extremities,  Plantars equivocal. Finger-to-nose is abnormal on the right  4. Ears and Eyes appear Normal, Conjunctivae clear, PERRLA. Moist Oral Mucosa.  5. Supple Neck, No JVD, No cervical lymphadenopathy appriciated, No Carotid Bruits. Tracheostomy site well healed  6. Symmetrical Chest wall movement, Good air movement bilaterally, CTAB.  7. RRR, No Gallops, Rubs or Murmurs, No Parasternal Heave.  8. Positive Bowel Sounds, Abdomen Soft, Non tender, No organomegaly appriciated,No rebound -guarding or rigidity.  9.  No Cyanosis,  Normal Skin Turgor, No Skin Rash or Bruise.  10. Good muscle tone,  joints appear normal , no effusions, Normal ROM.  11. No Palpable Lymph Nodes in Neck or Axillae    Data Review  CBC  Recent Labs Lab 12/22/13 1748  WBC 8.5  HGB 14.1  HCT 41.1  PLT 279  MCV 85.6  MCH 29.4  MCHC 34.3  RDW 12.4  LYMPHSABS 1.8  MONOABS 0.4  EOSABS 0.2  BASOSABS 0.0   ------------------------------------------------------------------------------------------------------------------  Chemistries   Recent Labs Lab 12/22/13 1748  NA 134*  K 4.4  CL 95*  CO2 25  GLUCOSE 303*  BUN 14  CREATININE 0.44*  CALCIUM 9.3  AST 20  ALT 13  ALKPHOS 83  BILITOT 0.4   ------------------------------------------------------------------------------------------------------------------ CrCl is unknown because there is no height on file for the current visit. ------------------------------------------------------------------------------------------------------------------ No results found for this basename: TSH, T4TOTAL, FREET3, T3FREE, THYROIDAB,  in the last 72 hours   Coagulation profile  Recent Labs Lab 12/22/13 1748  INR 1.03   ------------------------------------------------------------------------------------------------------------------- No results found for this basename: DDIMER,  in the last 72 hours -------------------------------------------------------------------------------------------------------------------  Cardiac Enzymes No results found for this basename: CK, CKMB, TROPONINI, MYOGLOBIN,  in the last 168 hours ------------------------------------------------------------------------------------------------------------------ No components found with this basename: POCBNP,    ---------------------------------------------------------------------------------------------------------------  Urinalysis No results found for this basename: colorurine, appearanceur, labspec,  phurine, glucoseu, hgbur, bilirubinur, ketonesur, proteinur, urobilinogen, nitrite, leukocytesur    ----------------------------------------------------------------------------------------------------------------  Imaging results:   Ct Head (brain) Wo Contrast  12/22/2013   ADDENDUM REPORT: 12/22/2013 18:08  ADDENDUM: Critical Value/emergent results were called by telephone at the time of interpretation on 12/22/2013 at 6:07 pm to Dr. Doy Mince, who verbally acknowledged these results.   Electronically Signed   By: Skipper Cliche M.D.   On: 12/22/2013 18:08   12/22/2013   CLINICAL DATA:  Code stroke, right-sided weakness  EXAM: CT HEAD WITHOUT CONTRAST  TECHNIQUE: Contiguous axial images were obtained from the base of the skull through the vertex without intravenous contrast.  COMPARISON:  None.  FINDINGS: Mild age-related atrophy. No evidence of vascular territory infarct, mass, hemorrhage, or extra-axial fluid. No hydrocephalus. Prominent perivascular spaces in the medial temporal lobes bilaterally, normal variant. Calvarium intact.  IMPRESSION: No acute finding  Electronically Signed: By: Skipper Cliche M.D. On: 12/22/2013 18:04    My personal review of EKG: Normal sinus rhythm at a rate of 94 beats per, no acute changes with mild prolongation of PR and left atrial enlargement    Assessment & Plan  1. ataxia with right sided weakness that resolved. Beyond the window  of TPA. CT of the head is negative, and MRI is pending. Finger-to-nose exam was abnormal, probably posterior circulation     Admit to telemetry     Aspirin 325 mg daily     Hold her losartan and glipizide for now     MRI of the brain pending     Check echo     Check carotid Dopplers     Neurochecks    2. Diabetes mellitus2     Insulin sliding scale     Glipizide on hold  3. Hypertension     Losartan is on hold  4. History of Guillain-Barr with respiratory failure in 74.          DVT Prophylaxis  is on   AM  Labs Ordered, also please review Full Orders  Family Communication: Admission, patients condition and plan of care including tests being ordered have been discussed with the patient and son at bedside  who indicate understanding and agree with the plan and Code Status.  Code Status full   Disposition Plan: Home   Time spent in minutes : 33 minutes   Condition GUARDED   @SIGNATURE @

## 2013-12-22 NOTE — Code Documentation (Signed)
Patient at home in her normal state of health this am. She was able to perform her usual morning activities.  At 8:30 she sat down in her rocking chair and took a nap.  At 1130 when she got up she felt weak and was "dragging" her right leg.  This afternoon family convinced her to seek medical attention.  She arrived via EMS at 1749.  Head CT done.  NIHSS 5, facial droop, right arm sensory decreased, ataxia arm and leg, mild dysarthria.  Son at bedside during assessment.  Dr Doy Mince at bedside to assess patient.  Per Dr Doy Mince: plan admit for stroke work up.  Patient outside the TPA window.

## 2013-12-22 NOTE — Progress Notes (Signed)
Late Entry: Pt arrived to floor at 2100 with no incidence. Pt's son asked about giving insurance information. Stated that no one took [employer] card in ED. Copies of insurance card and pt ID made and placed in shadow chart. Elspeth Cho, RN 12/23/2013 0001

## 2013-12-23 ENCOUNTER — Inpatient Hospital Stay (HOSPITAL_COMMUNITY): Payer: Medicare HMO

## 2013-12-23 DIAGNOSIS — I63219 Cerebral infarction due to unspecified occlusion or stenosis of unspecified vertebral arteries: Secondary | ICD-10-CM

## 2013-12-23 LAB — GLUCOSE, CAPILLARY
GLUCOSE-CAPILLARY: 317 mg/dL — AB (ref 70–99)
GLUCOSE-CAPILLARY: 319 mg/dL — AB (ref 70–99)
GLUCOSE-CAPILLARY: 267 mg/dL — AB (ref 70–99)
Glucose-Capillary: 247 mg/dL — ABNORMAL HIGH (ref 70–99)

## 2013-12-23 LAB — LIPID PANEL
Cholesterol: 135 mg/dL (ref 0–200)
HDL: 25 mg/dL — ABNORMAL LOW (ref >39)
LDL Cholesterol: 66 mg/dL (ref 0–99)
Total CHOL/HDL Ratio: 5.4 RATIO
Triglycerides: 220 mg/dL — ABNORMAL HIGH (ref <150)
VLDL: 44 mg/dL — AB (ref 0–40)

## 2013-12-23 LAB — HEMOGLOBIN A1C
Hgb A1c MFr Bld: 9.7 % — ABNORMAL HIGH (ref <5.7)
Hgb A1c MFr Bld: 9.5 % — ABNORMAL HIGH (ref <5.7)
MEAN PLASMA GLUCOSE: 232 mg/dL — AB (ref <117)
MEAN PLASMA GLUCOSE: 226 mg/dL — AB (ref <117)

## 2013-12-23 MED ORDER — KETOROLAC TROMETHAMINE 30 MG/ML IJ SOLN
30.0000 mg | Freq: Once | INTRAMUSCULAR | Status: AC
  Administered 2013-12-23: 30 mg via INTRAVENOUS
  Filled 2013-12-23: qty 1

## 2013-12-23 MED ORDER — INSULIN DETEMIR 100 UNIT/ML ~~LOC~~ SOLN
10.0000 [IU] | Freq: Every day | SUBCUTANEOUS | Status: DC
  Administered 2013-12-23: 10 [IU] via SUBCUTANEOUS
  Filled 2013-12-23 (×2): qty 0.1

## 2013-12-23 MED ORDER — CLOPIDOGREL BISULFATE 75 MG PO TABS
75.0000 mg | ORAL_TABLET | Freq: Every day | ORAL | Status: DC
  Administered 2013-12-23 – 2013-12-25 (×3): 75 mg via ORAL
  Filled 2013-12-23 (×3): qty 1

## 2013-12-23 MED ORDER — INSULIN ASPART 100 UNIT/ML ~~LOC~~ SOLN
0.0000 [IU] | Freq: Three times a day (TID) | SUBCUTANEOUS | Status: DC
  Administered 2013-12-23: 11 [IU] via SUBCUTANEOUS
  Administered 2013-12-24: 5 [IU] via SUBCUTANEOUS
  Administered 2013-12-24: 8 [IU] via SUBCUTANEOUS
  Administered 2013-12-24 – 2013-12-25 (×4): 5 [IU] via SUBCUTANEOUS

## 2013-12-23 MED ORDER — DIPHENHYDRAMINE HCL 50 MG/ML IJ SOLN
25.0000 mg | Freq: Once | INTRAMUSCULAR | Status: AC
  Administered 2013-12-23: 25 mg via INTRAVENOUS
  Filled 2013-12-23: qty 1

## 2013-12-23 MED ORDER — METOCLOPRAMIDE HCL 5 MG/ML IJ SOLN
10.0000 mg | Freq: Once | INTRAMUSCULAR | Status: AC
  Administered 2013-12-23: 10 mg via INTRAVENOUS
  Filled 2013-12-23: qty 2

## 2013-12-23 MED ORDER — LORAZEPAM 2 MG/ML IJ SOLN
1.0000 mg | Freq: Once | INTRAMUSCULAR | Status: AC
  Administered 2013-12-23: 1 mg via INTRAVENOUS
  Filled 2013-12-23: qty 1

## 2013-12-23 NOTE — Evaluation (Signed)
Speech Language Pathology Evaluation Patient Details Name: Carrie Moses MRN: [id number] DOB: August 08, 1940 Today's Date: 12/23/2013 Time: 3716-9678 SLP Time Calculation (min): 22 min  Problem List:  Patient Active Problem List   Diagnosis Date Noted  . Unspecified cerebral artery occlusion with cerebral infarction 12/22/2013  . HTN (hypertension) 12/22/2013  . DM (diabetes mellitus) 12/22/2013  . Hyperlipidemia 12/22/2013  . CVA (cerebral infarction) 12/22/2013   Past Medical History:  Past Medical History  Diagnosis Date  . Diabetes mellitus without complication   . Hypertension   . Hypercholesteremia    Past Surgical History: History reviewed. No pertinent past surgical history. HPI:  Carrie Moses is a 73 y.o. 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    Assessment / Plan / Recommendation Clinical Impression  Pt presents with mild impairments in retrieval of new information and mental flexibility, which she reports is not an acute change. She was also noted to have fronting of her glottal sounds which she describes as baseline as well, saying that she "doesn't talk plain." This is more consistent with developmental articulation impairment as opposed to an acute CVA. Overall, suspect that patient is functioning at or near her baseline and does not need acute SLP services at this time. She may however benefit from further evaluation of higher level abilities at next level of care (CIR recommended by PT).    SLP Assessment  All further Speech Lanaguage Pathology  needs can be addressed in the next venue of care    Follow Up Recommendations  Inpatient Rehab    Frequency and Duration        Pertinent Vitals/Pain Pain Assessment: No/denies pain   SLP Goals     SLP Evaluation Prior Functioning  Cognitive/Linguistic Baseline:  Information not available Type of Home: House  Lives With: Daughter;Other (Comment) (grandson) Available Help at Discharge: Family;Available 24 hours/day   Cognition  Overall Cognitive Status: History of cognitive impairments - at baseline (per patient report) Arousal/Alertness: Awake/alert Orientation Level: Oriented X4 Attention: Sustained Sustained Attention: Appears intact Memory: Impaired Memory Impairment: Decreased recall of new information;Retrieval deficit (mild) Awareness: Appears intact Problem Solving: Appears intact Safety/Judgment: Appears intact Comments: mildly impaired recall of informationa nd mental flexibility noted, which patient reports is not an acute change    Comprehension  Auditory Comprehension Overall Auditory Comprehension: Appears within functional limits for tasks assessed Visual Recognition/Discrimination Discrimination: Within Function Limits Reading Comprehension Reading Status: Not tested    Expression Expression Primary Mode of Expression: Verbal Verbal Expression Overall Verbal Expression: Appears within functional limits for tasks assessed Written Expression Dominant Hand: Right Written Expression: Not tested   Oral / Motor Motor Speech Overall Motor Speech: Impaired at baseline (articulation errors reported to be baseline per pt) Respiration: Within functional limits Phonation: Other (comment) (glottal fry) Resonance: Within functional limits Articulation: Impaired Level of Impairment: Conversation Intelligibility: Intelligible Motor Planning: Witnin functional limits Motor Speech Errors: Not applicable Interfering Components: Premorbid status   GO      Carrie Moses, M.A. CCC-SLP [phone]  Carrie Moses 12/23/2013, 4:40 PM

## 2013-12-23 NOTE — Evaluation (Signed)
Physical Therapy Evaluation Patient Details Name: Carrie Moses MRN: [id number] DOB: 02/25/1941 Today's Date: 12/23/2013   History of Present Illness  Carrie Moses is a 73 y.o. 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  Clinical Impression  Pt admitted with/for s/s of stroke shown by MRI to be acute infarcts in R cerebellar hemisphere and vermis.  Pt currently limited functionally due to the problems listed. ( See problems list.)   Pt will benefit from PT to maximize function and safety in order to get ready for next venue listed below.     Follow Up Recommendations CIR    Equipment Recommendations  None recommended by PT    Recommendations for Other Services Rehab consult     Precautions / Restrictions Precautions Precautions: Fall      Mobility  Bed Mobility Overal bed mobility: Needs Assistance Bed Mobility: Supine to Sit     Supine to sit: Min assist     General bed mobility comments: cues for sequencing; bridging without assist, coming up to EOB with min assist  Transfers Overall transfer level: Needs assistance Equipment used: Rolling walker (2 wheeled) Transfers: Sit to/from [facility] Sit to Stand: Min assist Stand pivot transfers: Min assist       General transfer comment: cues for hand placement; steady assist  Ambulation/Gait Ambulation/Gait assistance: Min assist Ambulation Distance (Feet): 16 Feet Assistive device: Rolling walker (2 wheeled) Gait Pattern/deviations: Step-through pattern;Ataxic Gait velocity: slow   General Gait Details: slow unequal steps, ataxic on R, need for some assist to maneuver the RW in a coordinated manner  Stairs            Wheelchair Mobility    Modified Rankin (Stroke Patients Only) Modified Rankin (Stroke Patients Only) Pre-Morbid Rankin Score:  No symptoms Modified Rankin: Moderately severe disability     Balance Overall balance assessment: Needs assistance Sitting-balance support: No upper extremity supported Sitting balance-Leahy Scale: Fair     Standing balance support: Bilateral upper extremity supported;Single extremity supported Standing balance-Leahy Scale: Poor                               Pertinent Vitals/Pain Pain Assessment: No/denies pain    Home Living Family/patient expects to be discharged to:: Private residence Living Arrangements: Children (daughter and grandson) Available Help at Discharge: Family;Available PRN/intermittently Type of Home: House Home Access: Ramped entrance     Home Layout: Two level;Able to live on main level with bedroom/bathroom Home Equipment: Gilford Rile - 2 wheels;Cane - single point;Shower seat;Bedside commode (comfort height toilet)      Prior Function Level of Independence: Independent         Comments: drove and ran errands; totally independent     Hand Dominance        Extremity/Trunk Assessment   Upper Extremity Assessment: Defer to OT evaluation           Lower Extremity Assessment: Overall WFL for tasks assessed;RLE deficits/detail RLE Deficits / Details: general weakness and decrease coordination/ataxias with open chained movement.       Communication   Communication: No difficulties  Cognition Arousal/Alertness: Awake/alert Behavior During Therapy: WFL for tasks assessed/performed Overall Cognitive Status: Within Functional Limits for tasks assessed  General Comments      Exercises        Assessment/Plan    PT Assessment Patient needs continued PT services  PT Diagnosis Difficulty walking   PT Problem List Decreased strength;Decreased activity tolerance;Decreased balance;Decreased mobility;Decreased coordination;Decreased knowledge of precautions  PT Treatment Interventions DME instruction;Gait  training;Functional mobility training;Therapeutic activities;Stair training;Balance training;Neuromuscular re-education;Patient/family education   PT Goals (Current goals can be found in the Care Plan section) Acute Rehab PT Goals Patient Stated Goal: back home and independent PT Goal Formulation: With patient Time For Goal Achievement: 01/06/14 Potential to Achieve Goals: Good    Frequency Min 4X/week   Barriers to discharge        Co-evaluation               End of Session   Activity Tolerance: Patient tolerated treatment well Patient left: in chair;with call bell/phone within reach Nurse Communication: Mobility status         Time: 1325-1401 PT Time Calculation (min): 36 min   Charges:   PT Evaluation $Initial PT Evaluation Tier I: 1 Procedure PT Treatments $Gait Training: 8-22 mins $Therapeutic Activity: 8-22 mins   PT G Codes:          Carrie Moses, Tessie Fass 12/23/2013, 3:09 PM 12/23/2013  Donnella Sham, PT [phone]  (pager)

## 2013-12-23 NOTE — Progress Notes (Addendum)
[facility] Progress Note   Carrie Moses NLG:[id number] DOB: 1941/04/23 DOA: 12/22/2013 PCP: No primary provider on file.  Brief narrative: Carrie Moses is a 73 y.o. female with PMH of HTN, DM 2 who presents with ataxia and feeling that her right leg was not working properly. Her son notes that her right leg was "spasming" or "twitching".  No other neurological symptoms noted.    Subjective: Has no complaints. Has not yet ambulated today and is unable to tell me if her symptoms have improved.   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  - will start Levemir, continue sliding scale insulin and start insulin teaching   Code Status: Full code Family Communication: with son at bedside Disposition Plan: CIR vs SNF DVT prophylaxis: Lovenox  Consultants: Neuro  Procedures: none  Antibiotics: Anti-infectives   None         Objective: Filed Weights   12/22/13 1808 12/23/13 0313  Weight: 97.523 kg (215 lb) 97.523 kg (215 lb)    Intake/Output Summary (Last 24 hours) at 12/23/13 1352 Last data filed at 12/23/13 0911  Gross per 24 hour  Intake    120 ml  Output    350 ml  Net   -230 ml     Vitals Filed Vitals:   12/23/13 0319 12/23/13 0503 12/23/13 0651 12/23/13 1025  BP:  151/57 138/58 166/66  Pulse:  95 95 94  Temp:  98.1 F (36.7 C) 98.2  F (36.8 C) 97.9 F (36.6 C)  TempSrc:  Oral Oral Oral  Resp:  18 18 18   Height: 5\' 2"  (1.575 m)     Weight:      SpO2:  99% 96% 93%    Exam: General: No acute respiratory distress Lungs: Clear to auscultation bilaterally without wheezes or crackles Cardiovascular: Regular rate and rhythm without murmur gallop or rub normal S1 and S2 Abdomen: Nontender, nondistended, soft, bowel sounds positive, no rebound, no ascites, no appreciable mass Extremities: No significant cyanosis, clubbing, or edema bilateral lower extremities Neuro: good strength in all 4 extermities, CN 2-12 intact  Data Reviewed: Basic Metabolic Panel:  Recent Labs Lab 12/22/13 1748  NA 134*  K 4.4  CL 95*  CO2 25  GLUCOSE 303*  BUN 14  CREATININE 0.44*  CALCIUM 9.3   Liver Function Tests:  Recent Labs Lab 12/22/13 1748  AST 20  ALT 13  ALKPHOS 83  BILITOT 0.4  PROT 7.0  ALBUMIN 3.8   No results found for this basename: LIPASE, AMYLASE,  in the last 168 hours No results found for this basename: AMMONIA,  in the last 168 hours CBC:  Recent Labs Lab 12/22/13 1748  WBC 8.5  NEUTROABS 6.2  HGB 14.1  HCT 41.1  MCV 85.6  PLT 279   Cardiac Enzymes: No results found for this basename: CKTOTAL, CKMB, CKMBINDEX, TROPONINI,  in the last 168 hours BNP (last 3 results) No results found for this basename: PROBNP,  in the last 8760 hours CBG:  Recent Labs Lab 12/22/13 1807 12/22/13 2320 12/23/13 0752 12/23/13 1146  GLUCAP 275* 253* 247* 317*    No results found for this or any previous visit (from the past 240 hour(s)).   Studies:  Recent x-ray studies have been reviewed in detail by the Attending Physician  Scheduled Meds:  Scheduled Meds: . clopidogrel  75 mg Oral Daily  . enoxaparin (LOVENOX) injection  40 mg Subcutaneous Q24H  . insulin aspart  0-5 Units Subcutaneous QHS  . insulin aspart  0-9 Units Subcutaneous TID WC   Continuous Infusions:   Time spent on care of this  patient: >35 min   [place], MD 12/23/2013, 1:52 PM  LOS: 1 day   [facility] Office  [phone] Pager - Text Page per [email or web address]  If 7PM-7AM, please contact night-coverage [email or web address]

## 2013-12-23 NOTE — Progress Notes (Signed)
STROKE TEAM PROGRESS NOTE   HISTORY Carrie Moses is an 73 y.o. female who reports waking up normal today. Was able to make breakfast and get her grandson off to school. She sat down in her rocking chair at about 0830 12/22/2013 and eventually took a nap in the chair. When she awakened she went to go to the bathroom and could not make her right leg work. Later in the day her son was made awake. They called her PCP who referred her to the ED. EMS was called and the patient as brought to the ED as a code stroke. Initial NIHSS of 5. Patient was not administered TPA secondary to delay in arrival. She was admitted for further evaluation and treatment.   SUBJECTIVE (INTERVAL HISTORY) Her son is at the bedside.  Overall she feels her condition is unchanged. She still feels incoordination on the right and will not do what she wants them to do. Son states her "muscles twitch". Denies nausea and vomiting. She lost her husband just about 2 weeks ago and since then she was not taking her medication and even not eating as per son. She was teary when talking about her husband.   OBJECTIVE   Recent Labs Lab 12/22/13 1807 12/22/13 2320 12/23/13 0752 12/23/13 1146  GLUCAP 275* 253* 247* 317*    Recent Labs Lab 12/22/13 1748  NA 134*  K 4.4  CL 95*  CO2 25  GLUCOSE 303*  BUN 14  CREATININE 0.44*  CALCIUM 9.3    Recent Labs Lab 12/22/13 1748  AST 20  ALT 13  ALKPHOS 83  BILITOT 0.4  PROT 7.0  ALBUMIN 3.8    Recent Labs Lab 12/22/13 1748  WBC 8.5  NEUTROABS 6.2  HGB 14.1  HCT 41.1  MCV 85.6  PLT 279   No results found for this basename: CKTOTAL, CKMB, CKMBINDEX, TROPONINI,  in the last 168 hours  Recent Labs  12/22/13 1748  LABPROT 13.5  INR 1.03    Recent Labs  12/22/13 2238  COLORURINE YELLOW  LABSPEC 1.008  PHURINE 7.0  GLUCOSEU 100*  HGBUR NEGATIVE  BILIRUBINUR NEGATIVE  KETONESUR NEGATIVE  PROTEINUR NEGATIVE  UROBILINOGEN 0.2  NITRITE NEGATIVE  LEUKOCYTESUR  SMALL*       Component Value Date/Time   CHOL 135 12/23/2013 0638   TRIG 220* 12/23/2013 0638   HDL 25* 12/23/2013 0638   CHOLHDL 5.4 12/23/2013 0638   VLDL 44* 12/23/2013 0638   LDLCALC 66 12/23/2013 0638   Lab Results  Component Value Date   HGBA1C 9.7* 12/22/2013   No results found for this basename: labopia,  cocainscrnur,  labbenz,  amphetmu,  thcu,  labbarb    No results found for this basename: ETH,  in the last 168 hours  Ct Head (brain) Wo Contrast 12/22/2013    No acute finding    Mr Brain Wo Contrast 12/23/2013    Acute posterior circulation and in the RIGHT superior vermis and RIGHT superior cerebellar hemisphere. No mass effect on the fourth ventricle or hemorrhage.    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  No proximal superior cerebellar artery disease on the RIGHT.      Dg Chest 2 View 12/23/2013   No acute abnormality noted.     CUS - pending  2D echo - pending  PHYSICAL EXAM Physical exam  Temp:  [97.7 F (36.5 C)-98.5 F (36.9 C)] 97.7 F (36.5  C) (09/01 Jul 28, 2114) Pulse Rate:  [6-100] 6 (09/01 07/28/14) Resp:  [18-20] 18 (09/01 07/28/2114) BP: (126-170)/(54-81) 126/81 mmHg (09/01 Jul 28, 2114) SpO2:  [72 %-99 %] 99 % (09/01 2114/07/28) Weight:  [215 lb (97.523 kg)] 215 lb (97.523 kg) (09/01 0313)  General - Well nourished, well developed, in no apparent distress.  Ophthalmologic - Sharp disc margins OU.  Cardiovascular - Regular rate and rhythm with no murmur.  Mental Status -  Level of arousal and orientation to time, place, and person were intact. Language including expression, naming, repetition, comprehension, reading, and writing was assessed and found intact, but scanning speech.  Cranial Nerves II - XII - II - Visual field intact OU. III, IV, VI - Extraocular movements intact. V - Facial sensation intact bilaterally. VII - Facial movement intact bilaterally. VIII - Hearing & vestibular intact  bilaterally. X - Palate elevates symmetrically, scanning speech. XI - Chin turning & shoulder shrug intact bilaterally. XII - Tongue protrusion intact.  Motor Strength - The patient's strength was normal in all extremities and pronator drift was absent.  Bulk was normal and fasciculations were absent.   Motor Tone - Muscle tone was assessed at the neck and appendages and was normal.  Reflexes - The patient's reflexes were normal in all extremities and she had no pathological reflexes.  Sensory - Light touch, temperature/pinprick were assessed and were normal.    Coordination - The patient right FTN and HTS showed ataxia. Difficulty with right rapid alternating movement. Tremor was absent.  Gait and Station - not tested.   ASSESSMENT/PLAN  [name] is a 73 y.o. 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 She did not receive IV t-PA due to delay in arrival. 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 Stroke work up underway.    Stroke:  right superior cerebellar and vermis infarct,  secondary to small vessel disease.  aspirin 81 mg orally every day prior to admission, now on no antithrombotic. Add plavix 75 mg daily.   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   OOB with assistance  Resultant R ataxia, scanning speech  Therapy needs:  pending  Ongoing aggressive risk factor management  Risk factor education  Disposition:  pending  Hypertension   Home meds:  cozaar.   Permissive hypertension BP <220/120 for 24-48 hours and then normalize within 5-7 days.  BP 137-166/59-117 past 24h  Stable  Hyperlipidemia  Home meds:  Fenofibrate. No statin indicated at this time.  LDL 66   At LDL  goal < 70 for diabetics  Diabetes  Home meds:  Glipizide, metformin.  HgbA1c 9.5   Started insulin in hospital  DM not in good control  Goal < 7.0  Other Stroke Risk Factors Advanced age   Obesity, Body mass index is 39.31 kg/(m^2).    Family hx cerebral aneurysm (father)  Hx of murmur years ago  Other Pertinent History  GB in 1969/07/27 s/p trach  Recent death of her husband   Hospital day # 1  SHARON BIBY, MSN, RN, ANVP-BC, ANP-BC, [facility] Pager: [phone] 12/23/2013 11:53 AM  I, the attending vascular neurologist, have personally obtained a history, examined the patient, evaluated laboratory data, individually viewed  imaging studies, and formulated the assessment and plan of care.  I have made any additions or clarifications directly to the above note and agree with the findings and plan as currently documented.   Rosalin Hawking, MD PhD Stroke Neurology 12/23/2013 10:22 PM   To contact Stroke Continuity provider, please refer to [email or web address] After hours, contact General Neurology

## 2013-12-23 NOTE — Evaluation (Addendum)
Occupational Therapy Evaluation Patient Details Name: Carrie Moses MRN: [id number] DOB: 06-07-1940 Today's Date: 12/23/2013    History of Present Illness Carrie Moses is a 73 y.o. 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   Clinical Impression   Pt admitted with above. Pt independent with ADLs, PTA. Feel pt will benefit from acute OT to increase independence prior to d/c.     Follow Up Recommendations  CIR    Equipment Recommendations  None recommended by OT    Recommendations for Other Services Rehab consult     Precautions / Restrictions Precautions Precautions: Fall      Mobility Bed Mobility General bed mobility comments: not assessed  Transfers Overall transfer level: Needs assistance Equipment used: Rolling walker (2 wheeled) Transfers: Sit to/from Stand Sit to Stand: Min guard   General transfer comment: cues for technique.          ADL Overall ADL's : Needs assistance/impaired     Grooming: Standing;Oral care;Brushing hair;Wash/dry hands;Min guard   Upper Body Bathing: Min guard;Standing   Lower Body Bathing: Min guard;Sit to/from stand   Upper Body Dressing : Sitting;Minimal assistance   Lower Body Dressing: Minimal assistance;Sit to/from stand   Toilet Transfer: Minimal assistance;Ambulation;RW;Comfort height toilet;Grab bars   Toileting- Clothing Manipulation and Hygiene: Min guard;Sit to/from stand       Functional mobility during ADLs: Minimal assistance;Rolling walker General ADL Comments: Recommended pt sit for LB ADLs. Educated on use of bag on walker. Encouraged pt to be using RUE during activities. Pt performed grooming and bathing at sink. pt with decreased activity tolerance requiring rest breaks-educated on deep breathing technique.     Vision    Pt wears reading glasses; history of  cataract surgery; reports no change from baseline  Visual fields: No apparent deficit    Tracking/Visual Pursuits: Other (comment) (difficult and nystagmus noted)             Perception     Praxis      Pertinent Vitals/Pain Pain Assessment: No/denies pain     Hand Dominance Right   Extremity/Trunk Assessment Upper Extremity Assessment Upper Extremity Assessment: Generalized weakness;RUE deficits/detail RUE Coordination: decreased fine motor;decreased gross motor   Lower Extremity Assessment Lower Extremity Assessment: Defer to PT evaluation RLE Deficits / Details: general weakness and decrease coordination/ataxias with open chained movement.       Communication Communication Communication: Expressive difficulties   Cognition Arousal/Alertness: Awake/alert Behavior During Therapy: WFL for tasks assessed/performed Overall Cognitive Status: [facility]                       General Comments       Exercises       Shoulder Instructions      Home Living Family/patient expects to be discharged to:: Private residence Living Arrangements: Children (daughter and grandson) Available Help at Discharge: Family;Available 24 hours/day Type of Home: House Home Access: Ramped entrance     Home Layout: Two level;Able to live on main level with bedroom/bathroom     Bathroom Shower/Tub: Tub/shower unit   Bathroom Toilet: Handicapped height     Home Equipment: Environmental consultant - 2 wheels;Cane - single point;Shower seat;Bedside commode;Grab bars - toilet      Lives With: Daughter;Other (Comment) (grandson)    Prior Functioning/Environment Level of Independence: Independent  Comments: drove and ran errands; totally independent    OT Diagnosis: Other (comment) (decreased balance/coordination)   OT Problem List: Decreased strength;Decreased activity tolerance;Impaired balance (sitting and/or standing);Decreased knowledge of use of DME or AE;Decreased knowledge of  precautions;Impaired vision/perception;Impaired UE functional use   OT Treatment/Interventions: Self-care/ADL training;Therapeutic exercise;DME and/or AE instruction;Neuromuscular education;Therapeutic activities;Patient/family education;Balance training;Visual/perceptual remediation/compensation    OT Goals(Current goals can be found in the care plan section) Acute Rehab OT Goals Patient Stated Goal: get better OT Goal Formulation: With patient Time For Goal Achievement: 12/30/13 Potential to Achieve Goals: Good ADL Goals Pt Will Perform Lower Body Dressing: with modified independence;sit to/from stand Pt Will Transfer to Toilet: with modified independence;ambulating;grab bars (elevated commode) Pt Will Perform Toileting - Clothing Manipulation and hygiene: with modified independence;sit to/from stand Additional ADL Goal #1: Pt will be independent with HEP for RUE to increase gross and fine motor coordination.   OT Frequency: Min 2X/week   Barriers to D/C:            Co-evaluation              End of Session Equipment Utilized During Treatment: Gait belt;Rolling walker (O2 placed back on at end of session)  Activity Tolerance: Patient tolerated treatment well Patient left: in chair;with call bell/phone within reach   Time: 2376-2831 OT Time Calculation (min): 31 min Charges:  OT General Charges $OT Visit: 1 Procedure OT Evaluation $Initial OT Evaluation Tier I: 1 Procedure OT Treatments $Self Care/Home Management : 8-22 mins G-CodesBenito Mccreedy OTR/L [phone] 12/23/2013, 4:44 PM

## 2013-12-23 NOTE — Progress Notes (Addendum)
Rehab Admissions Coordinator Note:  Patient was screened by Cleatrice Burke for appropriateness for an Inpatient Acute Rehab Consult per Pt recommendation. At this time, we are recommending Inpatient Rehab consult. [employer] insured.  Cleatrice Burke 12/23/2013, 3:38 PM  I can be reached at [phone].

## 2013-12-24 DIAGNOSIS — E119 Type 2 diabetes mellitus without complications: Secondary | ICD-10-CM

## 2013-12-24 DIAGNOSIS — I6330 Cerebral infarction due to thrombosis of unspecified cerebral artery: Secondary | ICD-10-CM

## 2013-12-24 DIAGNOSIS — I6350 Cerebral infarction due to unspecified occlusion or stenosis of unspecified cerebral artery: Secondary | ICD-10-CM

## 2013-12-24 DIAGNOSIS — I517 Cardiomegaly: Secondary | ICD-10-CM

## 2013-12-24 LAB — GLUCOSE, CAPILLARY
GLUCOSE-CAPILLARY: 212 mg/dL — AB (ref 70–99)
Glucose-Capillary: 211 mg/dL — ABNORMAL HIGH (ref 70–99)
Glucose-Capillary: 282 mg/dL — ABNORMAL HIGH (ref 70–99)
Glucose-Capillary: 220 mg/dL — ABNORMAL HIGH (ref 70–99)

## 2013-12-24 MED ORDER — INSULIN DETEMIR 100 UNIT/ML ~~LOC~~ SOLN
15.0000 [IU] | Freq: Every day | SUBCUTANEOUS | Status: DC
  Administered 2013-12-24: 15 [IU] via SUBCUTANEOUS
  Filled 2013-12-24 (×3): qty 0.15

## 2013-12-24 MED ORDER — INSULIN STARTER KIT- PEN NEEDLES (ENGLISH)
1.0000 | Freq: Once | Status: AC
  Administered 2013-12-24: 1
  Filled 2013-12-24 (×2): qty 1

## 2013-12-24 NOTE — Progress Notes (Signed)
  Echocardiogram 2D Echocardiogram has been performed.  Carrie Moses 12/24/2013, 9:42 AM

## 2013-12-24 NOTE — Progress Notes (Signed)
Rehab admissions - Evaluated for possible admission.  I met with patient.  She lives with her daughter and grandson.  She would like to come to inpatient rehab.  We would need authorization from [facility].  The other issue for me is limited bed availability the rest of this week.  Recommend pursuit of another rehab or SNF as I try to get authorization and try to obtain a bed.  Call me for questions.  #[phone]

## 2013-12-24 NOTE — Progress Notes (Addendum)
Inpatient Diabetes Program Recommendations  AACE/ADA: New Consensus Statement on Inpatient Glycemic Control (2013)  Target Ranges:  Prepandial:   less than 140 mg/dL      Peak postprandial:   less than 180 mg/dL (1-2 hours)      Critically ill patients:  140 - 180 mg/dL     PCP: Dr. Lisbeth Ply in [place], [place]  A1c 9.5%   **Spoke with patient about her A1c of 9.5%.  Explained what an A1c is and what it measures.  Reminded patient that her goal A1c is 7% or less per ADA standards to prevent both acute and long-term complications.  Encouraged patient to check her CBGs at least bid at home (fasting and another check within the day) and to record all CBGs in a logbook for her PCP to review.  **Discussed with patient the possibility that the MD may send her home on insulin.  Patient stated that the doctor had talked with her about that and she is willing to take insulin if necessary.  Discussed with patient the two ways to give insulin (vial/syringe method and insulin pens).  Patient stated she may prefer to use insulin pens at home but wanted to try both ways before she decided.  **Educated patient on insulin pen use at home.  Reviewed all steps if insulin pen including attachment of needle, 2-unit air shot, dialing up dose, giving injection, removing needle, disposal of sharps, storage of unused insulin, disposal of insulin etc.  Patient able to provide successful return demonstration.  Also reviewed troubleshooting with insulin pen.  MD to give patient Rxs for insulin pens and insulin pen needles if patient prefers to go home on insulin pens.  **Will have RNs work with patient on learning vial and syringe method of insulin administration.  Will order insulin teaching kit to bedside.    MD- Patient may do well with one shot of basal insulin per day in addition to her home oral meds (Metformin and Glipizide)  If patient prefers insulin pens at d/c, please order the following: 1. Levemir Flextouch  pen [Order # [id number]] 2. Insulin Pen needles 31 g x 80m [Order # [id number]  If patient prefers vial and syringe at d/c, please order the following: 1. Levemir vial [Order # [id number]]2. Insulin syringes U-100 0.315m[Order # [id number]  Will follow JeWyn QuakerN, MSN, CDE Diabetes Coordinator Inpatient Diabetes Program Team Pager: [phone]-10p)

## 2013-12-24 NOTE — Progress Notes (Signed)
Inpatient Diabetes Program Recommendations  AACE/ADA: New Consensus Statement on Inpatient Glycemic Control (2013)  Target Ranges:  Prepandial:   less than 140 mg/dL      Peak postprandial:   less than 180 mg/dL (1-2 hours)      Critically ill patients:  140 - 180 mg/dL    Results for ROMIE, KEEBLE (MRN [id number]) as of 12/24/2013 09:49  Ref. Range 12/23/2013 07:52 12/23/2013 11:46 12/23/2013 16:48 12/23/2013 21:18  Glucose-Capillary Latest Range: 70-99 mg/dL 247 (H) 317 (H) 319 (H) 267 (H)    Results for RIKKI, TROSPER (MRN [id number]) as of 12/24/2013 09:49  Ref. Range 12/24/2013 06:37  Glucose-Capillary Latest Range: 70-99 mg/dL 211 (H)    Results for CARINNA, NEWHART (MRN [id number]) as of 12/24/2013 09:49  Ref. Range 12/23/2013 06:38  Hemoglobin A1C Latest Range: <5.7 % 9.5 (H)      Home DM Meds: Metaglip 5/500- 2 tablets bidwc   **A1c shows patient not well controlled with oral DM medications alone.  Per MD notes, note that plan may be to send patient home on insulin to improve CBG control  **Note that Levemir 10 units QHS was started last PM.  Fasting glucose still elevated this AM.    MD- Please consider increasing Levemir to 15 units QHS (0.15 units/kg dosing)    Will follow Wyn Quaker RN, MSN, CDE Diabetes Coordinator Inpatient Diabetes Program Team Pager: [phone] (8a-10p)

## 2013-12-24 NOTE — Consult Note (Signed)
Physical Medicine and Rehabilitation Consult Reason for Consult: CVA Referring Physician: [facility]   HPI: Carrie Moses is a 73 y.o. 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 Patient independent prior to admission living with her daughter and 69 year old grandson.. Presented 12/22/2013 with right side weakness and ataxic gait. Patient was maintained on aspirin prior to admission. 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 MRA of the head with suspected 75% stenosis of the right vertebral distal V3 proximal V4 segment. No visible cerebellar branch occlusion. Echocardiogram pending. Patient did not receive TPA. Neurology services consulted placed on Plavix for CVA prophylaxis as well as subcutaneous Lovenox for DVT prophylaxis. Tolerating a regular consistency diet. Hemoglobin A1c of 9.5 with insulin therapy as directed. Physical and occupational therapy evaluation completed 12/23/2013 with recommendations for physical medicine rehabilitation consult.  Reviewed.the results. According to patient no residual deficits from her Heide Scales  Review of Systems  Gastrointestinal: Positive for constipation.  Musculoskeletal: Positive for myalgias.  Neurological: Positive for weakness.  All other systems reviewed and are negative.  Past Medical History  Diagnosis Date  . Diabetes mellitus without complication   . Hypertension   . Hypercholesteremia    History reviewed. No pertinent past surgical history. History reviewed. No pertinent family history. Social History:  reports that she has never smoked. She does not have any smokeless tobacco history on file. She reports that she does not drink alcohol or use illicit drugs. Allergies:  Allergies  Allergen Reactions  . Other Other (See Comments)    Bell  peppers cause nausea and vomiting   Medications Prior to Admission  Medication Sig Dispense Refill  . aspirin EC 81 MG tablet Take 81 mg by mouth daily.      . fenofibrate 160 MG tablet Take 160 mg by mouth daily.      Marland Kitchen glipiZIDE-metformin (METAGLIP) 5-500 MG per tablet Take 2 tablets by mouth 2 (two) times daily before a meal.      . losartan (COZAAR) 50 MG tablet Take 50 mg by mouth daily.        Home: Home Living Family/patient expects to be discharged to:: Private residence Living Arrangements: Children (daughter and grandson) Available Help at Discharge: Family;Available 24 hours/day Type of Home: House Home Access: Ramped entrance Home Layout: Two level;Able to live on main level with bedroom/bathroom Home Equipment: Gilford Rile - 2 wheels;Cane - single point;Shower seat;Bedside commode;Grab bars - toilet  Lives With: Daughter;Other (Comment) (grandson)  Functional History: Prior Function Level of Independence: Independent Comments: drove and ran errands; totally independent Functional Status:  Mobility: Bed Mobility Overal bed mobility: Needs Assistance Bed Mobility: Supine to Sit Supine to sit: Min assist General bed mobility comments: not assessed Transfers Overall transfer level: Needs assistance Equipment used: Rolling walker (2 wheeled) Transfers: Sit to/from Stand Sit to Stand: Min guard Stand pivot transfers: Min assist General transfer comment: cues for technique.  Ambulation/Gait Ambulation/Gait assistance: Min assist Ambulation Distance (Feet): 16 Feet Assistive device: Rolling walker (2 wheeled) Gait Pattern/deviations: Step-through pattern;Ataxic Gait velocity: slow General Gait Details: slow unequal steps, ataxic on R, need for some assist to maneuver the RW in a coordinated manner    ADL: ADL Overall ADL's : Needs assistance/impaired Grooming: Standing;Oral care;Brushing hair;Wash/dry hands;Min guard Upper Body Bathing: Min guard;Standing Lower  Body Bathing: Min guard;Sit to/from stand Upper Body Dressing : Sitting;Minimal  assistance Lower Body Dressing: Minimal assistance;Sit to/from stand Toilet Transfer: Minimal assistance;Ambulation;RW;Comfort height toilet;Grab bars Toileting- Clothing Manipulation and Hygiene: Min guard;Sit to/from stand Functional mobility during ADLs: Minimal assistance;Rolling walker General ADL Comments: Recommended pt sit for LB ADLs. Educated on use of bag on walker. Encouraged pt to be using RUE during activities. Pt performed grooming and bathing at sink.  Cognition: Cognition Overall Cognitive Status: History of cognitive impairments - at baseline (per patient report) Arousal/Alertness: Awake/alert Orientation Level: Oriented X4 Attention: Sustained Sustained Attention: Appears intact Memory: Impaired Memory Impairment: Decreased recall of new information;Retrieval deficit (mild) Awareness: Appears intact Problem Solving: Appears intact Safety/Judgment: Appears intact Comments: mildly impaired recall of informationa nd mental flexibility noted, which patient reports is not an acute change Cognition Arousal/Alertness: Awake/alert Behavior During Therapy: WFL for tasks assessed/performed Overall Cognitive Status: History of cognitive impairments - at baseline (per patient report)  Blood pressure 157/65, pulse 79, temperature 97.8 F (36.6 C), temperature source Oral, resp. rate 18, height 5\' 2"  (1.575 m), weight 97.523 kg (215 lb), SpO2 97.00%. Physical Exam  Constitutional: She is oriented to person, place, and time.  73 year old female sitting up at bedside  HENT:  Head: Normocephalic.  Eyes: EOM are normal.  Neck: Normal range of motion. Neck supple. No thyromegaly present.  Cardiovascular: Normal rate and regular rhythm.   Respiratory: Effort normal and breath sounds normal. No respiratory distress.  GI: Soft. Bowel sounds are normal. She exhibits no distension.  Neurological: She is  alert and oriented to person, place, and time.  Patient makes good eye contact with examiner. She follows full commands. She has a good awareness of deficits.  Skin: Skin is warm and dry.   moderate dysmetria right finger-nose-finger and right heel to shin Motor strength is 5/5 bilateral deltoid, bicep, tricep, grip, hip flexor, knee extensors, ankle dose flexor and plantar flexor  Results for orders placed during the hospital encounter of 12/22/13 (from the past 24 hour(s))  HEMOGLOBIN A1C     Status: Abnormal   Collection Time    12/23/13  6:38 AM      Result Value Ref Range   Hemoglobin A1C 9.5 (*) <5.7 %   Mean Plasma Glucose 226 (*) <117 mg/dL  LIPID PANEL     Status: Abnormal   Collection Time    12/23/13  6:38 AM      Result Value Ref Range   Cholesterol 135  0 - 200 mg/dL   Triglycerides 220 (*) <150 mg/dL   HDL 25 (*) >39 mg/dL   Total CHOL/HDL Ratio 5.4     VLDL 44 (*) 0 - 40 mg/dL   LDL Cholesterol 66  0 - 99 mg/dL  GLUCOSE, CAPILLARY     Status: Abnormal   Collection Time    12/23/13  7:52 AM      Result Value Ref Range   Glucose-Capillary 247 (*) 70 - 99 mg/dL   Comment 1 Notify RN     Comment 2 Documented in Chart    GLUCOSE, CAPILLARY     Status: Abnormal   Collection Time    12/23/13 11:46 AM      Result Value Ref Range   Glucose-Capillary 317 (*) 70 - 99 mg/dL   Comment 1 Notify RN     Comment 2 Documented in Chart    GLUCOSE, CAPILLARY     Status: Abnormal   Collection Time    12/23/13  4:48 PM      Result Value Ref Range  Glucose-Capillary 319 (*) 70 - 99 mg/dL   Comment 1 Notify RN     Comment 2 Documented in Chart    GLUCOSE, CAPILLARY     Status: Abnormal   Collection Time    12/23/13  9:18 PM      Result Value Ref Range   Glucose-Capillary 267 (*) 70 - 99 mg/dL   Comment 1 Notify RN     Comment 2 Documented in Chart     Dg Chest 2 View  12/23/2013   CLINICAL DATA:  Recent stroke  EXAM: CHEST  2 VIEW  COMPARISON:  None.  FINDINGS: Cardiac  shadow is at the upper limits of normal in size. The lungs are well-aerated without focal infiltrate. No acute bony abnormality is seen.  IMPRESSION: No acute abnormality noted.   Electronically Signed   By: Inez Catalina M.D.   On: 12/23/2013 08:02   Ct Head (brain) Wo Contrast  12/22/2013   ADDENDUM REPORT: 12/22/2013 18:08  ADDENDUM: Critical Value/emergent results were called by telephone at the time of interpretation on 12/22/2013 at 6:07 pm to Dr. Doy Mince, who verbally acknowledged these results.   Electronically Signed   By: Skipper Cliche M.D.   On: 12/22/2013 18:08   12/22/2013   CLINICAL DATA:  Code stroke, right-sided weakness  EXAM: CT HEAD WITHOUT CONTRAST  TECHNIQUE: Contiguous axial images were obtained from the base of the skull through the vertex without intravenous contrast.  COMPARISON:  None.  FINDINGS: Mild age-related atrophy. No evidence of vascular territory infarct, mass, hemorrhage, or extra-axial fluid. No hydrocephalus. Prominent perivascular spaces in the medial temporal lobes bilaterally, normal variant. Calvarium intact.  IMPRESSION: No acute finding  Electronically Signed: By: Skipper Cliche M.D. On: 12/22/2013 18:04   Mr Brain Wo Contrast  12/23/2013   CLINICAL DATA:  Patient awoke from a nap with dysmetria and sensory deficits. Acute posterior circulation event suspected. Stroke risk factors include diabetes, hypertension, and hypercholesterolemia.  EXAM: MRI HEAD WITHOUT CONTRAST  MRA HEAD WITHOUT CONTRAST  TECHNIQUE: Multiplanar, multiecho pulse sequences of the brain and surrounding structures were obtained without intravenous contrast. Angiographic images of the head were obtained using MRA technique without contrast.  COMPARISON:  CT head 12/22/2013.  FINDINGS: MRI HEAD FINDINGS  The patient was unable to remain motionless for the exam. Small or subtle lesions could be overlooked.  There is an acute posterior circulation infarct affecting the RIGHT superior vermis and  RIGHT superior cerebellar hemisphere. No mass effect on the fourth ventricle. No other areas of acute infarction.  No hemorrhage, mass lesion, hydrocephalus, or extra-axial fluid. Normal for age cerebral volume. Mild subcortical and periventricular T2 and FLAIR hyperintensities, likely chronic microvascular ischemic change. Flow voids are maintained in the carotid, basilar, and LEFT vertebral arteries. Poor flow related enhancement in the RIGHT vertebral. BILATERAL cataract extraction. Negative sinuses and orbits. No midline abnormality. Compared with the prior CT, the infarct is difficult to visualize.  MRA HEAD FINDINGS  The internal carotid arteries are widely patent. The basilar artery is widely patent with the LEFT vertebral as the dominant contributor. There is a suspected focal 75% stenosis of the RIGHT vertebral distal V3/proximal V4 segment. The distal RIGHT V4 vertebral segment is hypoplastic versus chronically disease above the origin of the RIGHT PICA.  There is no proximal flow reducing stenosis of the anterior or middle cerebral arteries. No MCA branch occlusion. Both posterior cerebral arteries are widely patent.  There is no appreciable disease in the superior cerebellar artery  visualized segments. No visible cerebellar branch occlusion. No intracranial aneurysm.  IMPRESSION: Acute posterior circulation and in the RIGHT superior vermis and RIGHT superior cerebellar hemisphere. No mass effect on the fourth ventricle or hemorrhage.  Suspected 75% stenosis of the RIGHT vertebral distal V3/proximal V4 segment.  No proximal superior cerebellar artery disease on the RIGHT.   Electronically Signed   By: Rolla Flatten M.D.   On: 12/23/2013 10:05   Mr Jodene Nam Head/brain Wo Cm  12/23/2013   CLINICAL DATA:  Patient awoke from a nap with dysmetria and sensory deficits. Acute posterior circulation event suspected. Stroke risk factors include diabetes, hypertension, and hypercholesterolemia.  EXAM: MRI HEAD WITHOUT  CONTRAST  MRA HEAD WITHOUT CONTRAST  TECHNIQUE: Multiplanar, multiecho pulse sequences of the brain and surrounding structures were obtained without intravenous contrast. Angiographic images of the head were obtained using MRA technique without contrast.  COMPARISON:  CT head 12/22/2013.  FINDINGS: MRI HEAD FINDINGS  The patient was unable to remain motionless for the exam. Small or subtle lesions could be overlooked.  There is an acute posterior circulation infarct affecting the RIGHT superior vermis and RIGHT superior cerebellar hemisphere. No mass effect on the fourth ventricle. No other areas of acute infarction.  No hemorrhage, mass lesion, hydrocephalus, or extra-axial fluid. Normal for age cerebral volume. Mild subcortical and periventricular T2 and FLAIR hyperintensities, likely chronic microvascular ischemic change. Flow voids are maintained in the carotid, basilar, and LEFT vertebral arteries. Poor flow related enhancement in the RIGHT vertebral. BILATERAL cataract extraction. Negative sinuses and orbits. No midline abnormality. Compared with the prior CT, the infarct is difficult to visualize.  MRA HEAD FINDINGS  The internal carotid arteries are widely patent. The basilar artery is widely patent with the LEFT vertebral as the dominant contributor. There is a suspected focal 75% stenosis of the RIGHT vertebral distal V3/proximal V4 segment. The distal RIGHT V4 vertebral segment is hypoplastic versus chronically disease above the origin of the RIGHT PICA.  There is no proximal flow reducing stenosis of the anterior or middle cerebral arteries. No MCA branch occlusion. Both posterior cerebral arteries are widely patent.  There is no appreciable disease in the superior cerebellar artery visualized segments. No visible cerebellar branch occlusion. No intracranial aneurysm.  IMPRESSION: Acute posterior circulation and in the RIGHT superior vermis and RIGHT superior cerebellar hemisphere. No mass effect on the  fourth ventricle or hemorrhage.  Suspected 75% stenosis of the RIGHT vertebral distal V3/proximal V4 segment.  No proximal superior cerebellar artery disease on the RIGHT.   Electronically Signed   By: Rolla Flatten M.D.   On: 12/23/2013 10:05    Assessment/Plan: Diagnosis: Right cerebellar infarct with right hemiataxia 1. Does the need for close, 24 hr/day medical supervision in concert with the patient's rehab needs make it unreasonable for this patient to be served in a less intensive setting? Yes 2. Co-Morbidities requiring supervision/potential complications: Hypertension, diabetes 3. Due to bladder management, bowel management, safety, skin/wound care, disease management, medication administration and patient education, does the patient require 24 hr/day rehab nursing? Yes 4. Does the patient require coordinated care of a physician, rehab nurse, PT (1-2 hrs/day, 5 days/week) and OT (1-2 hrs/day, 5 days/week) to address physical and functional deficits in the context of the above medical diagnosis(es)? Yes Addressing deficits in the following areas: balance, endurance, locomotion, transferring, bowel/bladder control, bathing, dressing and toileting 5. Can the patient actively participate in an intensive therapy program of at least 3 hrs of therapy per day at  least 5 days per week? Yes 6. The potential for patient to make measurable gains while on inpatient rehab is excellent 7. Anticipated functional outcomes upon discharge from inpatient rehab are modified independent  with PT, modified independent with OT, n/a with SLP. 8. Estimated rehab length of stay to reach the above functional goals is: 7-10 days 9. Does the patient have adequate social supports to accommodate these discharge functional goals? Yes 10. Anticipated D/C setting: Home 11. Anticipated post D/C treatments: [place] therapy 12. Overall Rehab/Functional Prognosis: excellent  RECOMMENDATIONS: This patient's condition is appropriate  for continued rehabilitative care in the following setting: CIR Patient has agreed to participate in recommended program. Yes Note that insurance prior authorization may be required for reimbursement for recommended care.  Comment:     12/24/2013

## 2013-12-24 NOTE — Progress Notes (Addendum)
[facility] PROGRESS NOTE  [place] LYY:[id number] DOB: [birth year] DOA: 12/22/2013 PCP: No primary provider on file.  Assessment/Plan: Principal Problem:   CVA (cerebral infarction) Active Problems:   Unspecified cerebral artery occlusion with cerebral infarction   HTN (hypertension)   DM (diabetes mellitus)   Hyperlipidemia       Brief narrative:  Carrie Moses is a 73 y.o. female with PMH of HTN, DM 2 who presents with ataxia and feeling that her right leg was not working properly. Her son notes that her right leg was "spasming" or "twitching". No other neurological symptoms noted.  Subjective:  Has no complaints. Has not yet ambulated today and is unable to tell me if her symptoms have improved.  Assessment/Plan:  Principal Problem:  right superior cerebellar and vermis infarct, secondary to small vessel disease aspirin 81 mg orally every day prior to admission, now on no antithrombotic. Add plavix 75 mg daily. MRI right superior cerebellar and vermis infarct MRA R VA stenosis 75%, no SCA stenosis CIR approval pending vs skilled nursing  Carotid doppler suggest 1-39% internal carotid artery stenosis bilaterally. Vertebral arteries are patent with antegrade flow.  Echo shows  .grade 1 diastolic, nl EF  dysfunction). - LDL 66- will not need statin     Hypertension  Home meds: cozaar.  Permissive hypertension BP <220/120 for 24-48 hours and then normalize within 5-7 days.  BP 137-166/59-117 past 24h  Stable Hyperlipidemia  Home meds: Fenofibrate. No statin indicated at this time.  LDL 66  At LDL goal < 70 for diabetics Diabetes  Home meds: Glipizide, metformin.  HgbA1c 9.5  Started insulin in hospital  DM not in good control  increasing Levemir to 15 units QHS (0.15 units/kg dosing Other Stroke Risk Factors  Advanced age Obesity, Body mass index is 39.31 kg/(m^2).  Family hx cerebral aneurysm (father) Hx of murmur years ago   Code Status: Full code   Family Communication: with son at bedside  Disposition Plan: CIR vs SNF  DVT prophylaxis: Lovenox  Consultants:  Neuro  Procedures:  none  Antibiotics:  Anti-infectives    None          HPI/Subjective: CBG uncontrolled ,otherwise no complaints    Objective: Filed Vitals:   12/23/13 1831 12/23/13 2116 12/24/13 0149 12/24/13 0621  BP: 160/59 126/81 157/65 141/64  Pulse: 95 96 79 82  Temp: 97.9 F (36.6 C) 97.7 F (36.5 C) 97.8 F (36.6 C) 97.8 F (36.6 C)  TempSrc: Oral Oral Oral Oral  Resp: 18 18 18 20   Height:      Weight:      SpO2: 93% 99% 97% 94%    Intake/Output Summary (Last 24 hours) at 12/24/13 1247 Last data filed at 12/24/13 0813  Gross per 24 hour  Intake    720 ml  Output      0 ml  Net    720 ml    Exam:  General: alert & oriented x 3 In NAD  Cardiovascular: RRR, nl S1 s2  Respiratory: Decreased breath sounds at the bases, scattered rhonchi, no crackles  Abdomen: soft +BS NT/ND, no masses palpable  Extremities: No cyanosis and no edema      Data Reviewed: Basic Metabolic Panel:  Recent Labs Lab 12/22/13 1748  NA 134*  K 4.4  CL 95*  CO2 25  GLUCOSE 303*  BUN 14  CREATININE 0.44*  CALCIUM 9.3    Liver Function Tests:  Recent Labs Lab 12/22/13 1748  AST 20  ALT 13  ALKPHOS 83  BILITOT 0.4  PROT 7.0  ALBUMIN 3.8   No results found for this basename: LIPASE, AMYLASE,  in the last 168 hours No results found for this basename: AMMONIA,  in the last 168 hours  CBC:  Recent Labs Lab 12/22/13 1748  WBC 8.5  NEUTROABS 6.2  HGB 14.1  HCT 41.1  MCV 85.6  PLT 279    Cardiac Enzymes: No results found for this basename: CKTOTAL, CKMB, CKMBINDEX, TROPONINI,  in the last 168 hours BNP (last 3 results) No results found for this basename: PROBNP,  in the last 8760 hours   CBG:  Recent Labs Lab 12/23/13 1146 12/23/13 1648 12/23/13 2118 12/24/13 0637 12/24/13 1202  GLUCAP 317* 319* 267* 211* 282*    No  results found for this or any previous visit (from the past 240 hour(s)).   Studies: Dg Chest 2 View  12/23/2013   CLINICAL DATA:  Recent stroke  EXAM: CHEST  2 VIEW  COMPARISON:  None.  FINDINGS: Cardiac shadow is at the upper limits of normal in size. The lungs are well-aerated without focal infiltrate. No acute bony abnormality is seen.  IMPRESSION: No acute abnormality noted.   Electronically Signed   By: Inez Catalina M.D.   On: 12/23/2013 08:02   Ct Head (brain) Wo Contrast  12/22/2013   ADDENDUM REPORT: 12/22/2013 18:08  ADDENDUM: Critical Value/emergent results were called by telephone at the time of interpretation on 12/22/2013 at 6:07 pm to Dr. Doy Mince, who verbally acknowledged these results.   Electronically Signed   By: Skipper Cliche M.D.   On: 12/22/2013 18:08   12/22/2013   CLINICAL DATA:  Code stroke, right-sided weakness  EXAM: CT HEAD WITHOUT CONTRAST  TECHNIQUE: Contiguous axial images were obtained from the base of the skull through the vertex without intravenous contrast.  COMPARISON:  None.  FINDINGS: Mild age-related atrophy. No evidence of vascular territory infarct, mass, hemorrhage, or extra-axial fluid. No hydrocephalus. Prominent perivascular spaces in the medial temporal lobes bilaterally, normal variant. Calvarium intact.  IMPRESSION: No acute finding  Electronically Signed: By: Skipper Cliche M.D. On: 12/22/2013 18:04   Mr Brain Wo Contrast  12/23/2013   CLINICAL DATA:  Patient awoke from a nap with dysmetria and sensory deficits. Acute posterior circulation event suspected. Stroke risk factors include diabetes, hypertension, and hypercholesterolemia.  EXAM: MRI HEAD WITHOUT CONTRAST  MRA HEAD WITHOUT CONTRAST  TECHNIQUE: Multiplanar, multiecho pulse sequences of the brain and surrounding structures were obtained without intravenous contrast. Angiographic images of the head were obtained using MRA technique without contrast.  COMPARISON:  CT head 12/22/2013.  FINDINGS: MRI  HEAD FINDINGS  The patient was unable to remain motionless for the exam. Small or subtle lesions could be overlooked.  There is an acute posterior circulation infarct affecting the RIGHT superior vermis and RIGHT superior cerebellar hemisphere. No mass effect on the fourth ventricle. No other areas of acute infarction.  No hemorrhage, mass lesion, hydrocephalus, or extra-axial fluid. Normal for age cerebral volume. Mild subcortical and periventricular T2 and FLAIR hyperintensities, likely chronic microvascular ischemic change. Flow voids are maintained in the carotid, basilar, and LEFT vertebral arteries. Poor flow related enhancement in the RIGHT vertebral. BILATERAL cataract extraction. Negative sinuses and orbits. No midline abnormality. Compared with the prior CT, the infarct is difficult to visualize.  MRA HEAD FINDINGS  The internal carotid arteries are widely patent. The basilar artery is widely patent with the LEFT vertebral as the dominant contributor.  There is a suspected focal 75% stenosis of the RIGHT vertebral distal V3/proximal V4 segment. The distal RIGHT V4 vertebral segment is hypoplastic versus chronically disease above the origin of the RIGHT PICA.  There is no proximal flow reducing stenosis of the anterior or middle cerebral arteries. No MCA branch occlusion. Both posterior cerebral arteries are widely patent.  There is no appreciable disease in the superior cerebellar artery visualized segments. No visible cerebellar branch occlusion. No intracranial aneurysm.  IMPRESSION: Acute posterior circulation and in the RIGHT superior vermis and RIGHT superior cerebellar hemisphere. No mass effect on the fourth ventricle or hemorrhage.  Suspected 75% stenosis of the RIGHT vertebral distal V3/proximal V4 segment.  No proximal superior cerebellar artery disease on the RIGHT.   Electronically Signed   By: Rolla Flatten M.D.   On: 12/23/2013 10:05   Mr Jodene Nam Head/brain Wo Cm  12/23/2013   CLINICAL DATA:   Patient awoke from a nap with dysmetria and sensory deficits. Acute posterior circulation event suspected. Stroke risk factors include diabetes, hypertension, and hypercholesterolemia.  EXAM: MRI HEAD WITHOUT CONTRAST  MRA HEAD WITHOUT CONTRAST  TECHNIQUE: Multiplanar, multiecho pulse sequences of the brain and surrounding structures were obtained without intravenous contrast. Angiographic images of the head were obtained using MRA technique without contrast.  COMPARISON:  CT head 12/22/2013.  FINDINGS: MRI HEAD FINDINGS  The patient was unable to remain motionless for the exam. Small or subtle lesions could be overlooked.  There is an acute posterior circulation infarct affecting the RIGHT superior vermis and RIGHT superior cerebellar hemisphere. No mass effect on the fourth ventricle. No other areas of acute infarction.  No hemorrhage, mass lesion, hydrocephalus, or extra-axial fluid. Normal for age cerebral volume. Mild subcortical and periventricular T2 and FLAIR hyperintensities, likely chronic microvascular ischemic change. Flow voids are maintained in the carotid, basilar, and LEFT vertebral arteries. Poor flow related enhancement in the RIGHT vertebral. BILATERAL cataract extraction. Negative sinuses and orbits. No midline abnormality. Compared with the prior CT, the infarct is difficult to visualize.  MRA HEAD FINDINGS  The internal carotid arteries are widely patent. The basilar artery is widely patent with the LEFT vertebral as the dominant contributor. There is a suspected focal 75% stenosis of the RIGHT vertebral distal V3/proximal V4 segment. The distal RIGHT V4 vertebral segment is hypoplastic versus chronically disease above the origin of the RIGHT PICA.  There is no proximal flow reducing stenosis of the anterior or middle cerebral arteries. No MCA branch occlusion. Both posterior cerebral arteries are widely patent.  There is no appreciable disease in the superior cerebellar artery visualized  segments. No visible cerebellar branch occlusion. No intracranial aneurysm.  IMPRESSION: Acute posterior circulation and in the RIGHT superior vermis and RIGHT superior cerebellar hemisphere. No mass effect on the fourth ventricle or hemorrhage.  Suspected 75% stenosis of the RIGHT vertebral distal V3/proximal V4 segment.  No proximal superior cerebellar artery disease on the RIGHT.   Electronically Signed   By: Rolla Flatten M.D.   On: 12/23/2013 10:05    Scheduled Meds: . clopidogrel  75 mg Oral Daily  . enoxaparin (LOVENOX) injection  40 mg Subcutaneous Q24H  . insulin aspart  0-15 Units Subcutaneous TID WC  . insulin aspart  0-5 Units Subcutaneous QHS  . insulin detemir  15 Units Subcutaneous QHS   Continuous Infusions:   Principal Problem:   CVA (cerebral infarction) Active Problems:   Unspecified cerebral artery occlusion with cerebral infarction   HTN (hypertension)   DM (  diabetes mellitus)   Hyperlipidemia    Time spent: 40 minutes   [facility] Pager [phone]. If 7PM-7AM, please contact night-coverage at [email or web address], password [facility] 12/24/2013, 12:47 PM  LOS: 2 days

## 2013-12-24 NOTE — Progress Notes (Signed)
Physical Therapy Treatment Patient Details Name: Carrie Moses MRN: [id number] DOB: 02/28/1941 Today's Date: 12/24/2013    History of Present Illness Carrie Moses is a 73 y.o. 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    PT Comments    Progressing as expected.  Ataxia and quality of gait improves with time up on her feet.  Emphasized gait quality.   Follow Up Recommendations  CIR     Equipment Recommendations  None recommended by PT    Recommendations for Other Services Rehab consult     Precautions / Restrictions Precautions Precautions: Fall    Mobility  Bed Mobility Overal bed mobility: Needs Assistance             General bed mobility comments: up in chair already  Transfers Overall transfer level: Needs assistance Equipment used: Rolling walker (2 wheeled) Transfers: Sit to/from Stand Sit to Stand: Min guard Stand pivot transfers: Min assist       General transfer comment: cues for technique and hand placement  Ambulation/Gait Ambulation/Gait assistance: Min assist;Mod assist Ambulation Distance (Feet): 130 Feet Assistive device: Rolling walker (2 wheeled);1 person hand held assist Gait Pattern/deviations: Step-through pattern;Decreased step length - right;Decreased step length - left;Decreased stride length;Ataxic;Wide base of support Gait velocity: slow   General Gait Details: Worked to improve gait quality and stability by using RW and transitioning to HHA with and without rail.  Emphasis on heel toe gait without looking down.   Stairs            Wheelchair Mobility    Modified Rankin (Stroke Patients Only) Modified Rankin (Stroke Patients Only) Pre-Morbid Rankin Score: No symptoms Modified Rankin: Moderately severe disability     Balance Overall balance assessment: No apparent balance  deficits (not formally assessed) Sitting-balance support: No upper extremity supported Sitting balance-Leahy Scale: Fair       Standing balance-Leahy Scale: Poor                      Cognition Arousal/Alertness: Awake/alert Behavior During Therapy: WFL for tasks assessed/performed Overall Cognitive Status: Within Functional Limits for tasks assessed                      Exercises      General Comments        Pertinent Vitals/Pain Pain Assessment: No/denies pain    Home Living                      Prior Function            PT Goals (current goals can now be found in the care plan section) Acute Rehab PT Goals Patient Stated Goal: get better PT Goal Formulation: With patient Time For Goal Achievement: 01/06/14 Potential to Achieve Goals: Good Progress towards PT goals: Progressing toward goals    Frequency  Min 4X/week    PT Plan Current plan remains appropriate    Co-evaluation             End of Session   Activity Tolerance: Patient tolerated treatment well Patient left: in chair;with call bell/phone within reach     Time: 6659-9357 PT Time Calculation (min): 20 min  Charges:  $Gait Training: 8-22 mins  G Codes:      Janssen Zee, Tessie Fass 12/24/2013, 2:00 PM 12/24/2013  Donnella Sham, PT [phone]  (pager)

## 2013-12-24 NOTE — Progress Notes (Signed)
*  PRELIMINARY RESULTS* Vascular Ultrasound Carotid Duplex (Doppler) has been completed.  Findings suggest 1-39% internal carotid artery stenosis bilaterally. Vertebral arteries are patent with antegrade flow.  12/24/2013 10:35 AM Maudry Mayhew, RVT, RDCS, RDMS

## 2013-12-24 NOTE — Progress Notes (Signed)
STROKE TEAM PROGRESS NOTE   HISTORY Carrie Moses is an 73 y.o. female who reports waking up normal today. Was able to make breakfast and get her grandson off to school. She sat down in her rocking chair at about 0830 12/22/2013 and eventually took a nap in the chair. When she awakened she went to go to the bathroom and could not make her right leg work. Later in the day her son was made awake. They called her PCP who referred her to the ED. EMS was called and the patient as brought to the ED as a code stroke. Initial NIHSS of 5. Patient was not administered TPA secondary to delay in arrival. She was admitted for further evaluation and treatment.   SUBJECTIVE (INTERVAL HISTORY) No new complains. Ataxia improved some. 2D echo and CUS done.  OBJECTIVE   Recent Labs Lab 12/23/13 0752 12/23/13 1146 12/23/13 1648 12/23/13 2118 12/24/13 0637  GLUCAP 247* 317* 319* 267* 211*    Recent Labs Lab 12/22/13 1748  NA 134*  K 4.4  CL 95*  CO2 25  GLUCOSE 303*  BUN 14  CREATININE 0.44*  CALCIUM 9.3    Recent Labs Lab 12/22/13 1748  AST 20  ALT 13  ALKPHOS 83  BILITOT 0.4  PROT 7.0  ALBUMIN 3.8    Recent Labs Lab 12/22/13 1748  WBC 8.5  NEUTROABS 6.2  HGB 14.1  HCT 41.1  MCV 85.6  PLT 279   No results found for this basename: CKTOTAL, CKMB, CKMBINDEX, TROPONINI,  in the last 168 hours  Recent Labs  12/22/13 1748  LABPROT 13.5  INR 1.03    Recent Labs  12/22/13 2238  COLORURINE YELLOW  LABSPEC 1.008  PHURINE 7.0  GLUCOSEU 100*  HGBUR NEGATIVE  BILIRUBINUR NEGATIVE  KETONESUR NEGATIVE  PROTEINUR NEGATIVE  UROBILINOGEN 0.2  NITRITE NEGATIVE  LEUKOCYTESUR SMALL*       Component Value Date/Time   CHOL 135 12/23/2013 0638   TRIG 220* 12/23/2013 0638   HDL 25* 12/23/2013 0638   CHOLHDL 5.4 12/23/2013 0638   VLDL 44* 12/23/2013 0638   LDLCALC 66 12/23/2013 0638   Lab Results  Component Value Date   HGBA1C 9.5* 12/23/2013   No results found for this basename:  labopia,  cocainscrnur,  labbenz,  amphetmu,  thcu,  labbarb    No results found for this basename: ETH,  in the last 168 hours  Ct Head (brain) Wo Contrast 12/22/2013    No acute finding    Mr Brain Wo Contrast 12/23/2013    Acute posterior circulation and in the RIGHT superior vermis and RIGHT superior cerebellar hemisphere. No mass effect on the fourth ventricle or hemorrhage.    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  No proximal superior cerebellar artery disease on the RIGHT.      Dg Chest 2 View 12/23/2013   No acute abnormality noted.     CUS - Bilateral: 1-39% ICA stenosis. Vertebral artery flow is antegrade.  2D echo - Left ventricle: The cavity size was normal. Wall thickness was increased in a pattern of mild LVH. Systolic function was vigorous. The estimated ejection fraction was in the range of 65% to 70%. Wall motion was normal; there were no regional wall motion abnormalities. Doppler parameters are consistent with abnormal left ventricular relaxation (grade 1 diastolic dysfunction). - Right atrium: The atrium was mildly dilated.   PHYSICAL EXAM  Temp:  [  97.7 F (36.5 C)-97.9 F (36.6 C)] 97.8 F (36.6 C) (09/02 0621) Pulse Rate:  [79-96] 82 (09/02 0621) Resp:  [18-20] 20 (09/02 0621) BP: (126-170)/(59-81) 141/64 mmHg (09/02 0621) SpO2:  [93 %-99 %] 94 % (09/02 0621)  General - Well nourished, well developed, in no apparent distress.  Ophthalmologic - Sharp disc margins OU.  Cardiovascular - Regular rate and rhythm with no murmur.  Mental Status -  Level of arousal and orientation to time, place, and person were intact. Language including expression, naming, repetition, comprehension, reading, and writing was assessed and found intact, but scanning speech.  Cranial Nerves II - XII - II - Visual field intact OU. III, IV, VI - Extraocular movements intact. V - Facial sensation intact  bilaterally. VII - Facial movement intact bilaterally. VIII - Hearing & vestibular intact bilaterally. X - Palate elevates symmetrically, scanning speech. XI - Chin turning & shoulder shrug intact bilaterally. XII - Tongue protrusion intact.  Motor Strength - The patient's strength was normal in all extremities and pronator drift was absent.  Bulk was normal and fasciculations were absent.   Motor Tone - Muscle tone was assessed at the neck and appendages and was normal.  Reflexes - The patient's reflexes were normal in all extremities and she had no pathological reflexes.  Sensory - Light touch, temperature/pinprick were assessed and were normal.    Coordination - The patient right FTN and HTS showed ataxia. Difficulty with right rapid alternating movement. Tremor was absent.  Gait and Station - not tested.   ASSESSMENT/PLAN  Ms. Carrie Moses is a 73 y.o. 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 She did not receive IV t-PA due to delay in arrival. 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 Stroke work up underway.    Stroke:  right superior cerebellar and vermis infarct,  secondary to small vessel disease.  aspirin 81 mg orally every day prior to admission, now on no antithrombotic. Add plavix 75 mg daily.   MRI  right superior cerebellar and vermis infarct  MRA  R VA stenosis 75%, no SCA stenosis  Carotid Doppler  unremarkable  2D Echo  EF 65-70%   Lovenox 40 mg sq daily for VTE prophylaxis  Carb Control thin liquids.   OOB with assistance  Resultant R ataxia, scanning speech  Therapy needs:  CIR  Ongoing aggressive risk factor management  Risk factor education  Disposition:  CIR  Hypertension   Home meds:  cozaar.   Permissive hypertension BP <220/120 for 24-48 hours and then normalize within 5-7 days.  BP 137-166/59-117 past  24h  Stable  Hyperlipidemia  Home meds:  Fenofibrate. No statin indicated at this time.  LDL 66   At LDL goal < 70 for diabetics  Diabetes  Home meds:  Glipizide, metformin.  HgbA1c 9.5   Started insulin in hospital  DM not in good control  Goal < 7.0  Other Stroke Risk Factors Advanced age   Obesity, Body mass index is 39.31 kg/(m^2).    Family hx cerebral aneurysm (father)  Hx of murmur years ago  Other Pertinent History  GB in 08-Aug-1969 s/p trach  Recent death of her husband   Hospital day # 2  Neurology will sign off. Pt will follow up with Dr. Erlinda Hong in clinic in about 2 months. Please call with further questions.  Burnetta Sabin, MSN, RN, ANVP-BC, ANP-BC, GNP-BC [facility] Pager: [phone]  12/24/2013 9:52 AM  I, the attending vascular neurologist, have personally obtained a history, examined the patient, evaluated laboratory data, individually viewed imaging studies, and formulated the assessment and plan of care.  I have made any additions or clarifications directly to the above note and agree with the findings and plan as currently documented.   Rosalin Hawking, MD PhD Stroke Neurology 12/24/2013 11:51 PM  To contact Stroke Continuity provider, please refer to [email or web address] After hours, contact General Neurology

## 2013-12-25 LAB — COMPREHENSIVE METABOLIC PANEL
ALT: 14 U/L (ref 0–35)
ANION GAP: 13 (ref 5–15)
AST: 19 U/L (ref 0–37)
Albumin: 3.5 g/dL (ref 3.5–5.2)
Alkaline Phosphatase: 74 U/L (ref 39–117)
BUN: 13 mg/dL (ref 6–23)
CALCIUM: 9.1 mg/dL (ref 8.4–10.5)
CO2: 28 meq/L (ref 19–32)
CREATININE: 0.53 mg/dL (ref 0.50–1.10)
Chloride: 99 mEq/L (ref 96–112)
GFR calc Af Amer: >90 mL/min (ref >90)
Glucose, Bld: 230 mg/dL — ABNORMAL HIGH (ref 70–99)
Potassium: 4.4 mEq/L (ref 3.7–5.3)
Sodium: 140 mEq/L (ref 137–147)
Total Bilirubin: 0.4 mg/dL (ref 0.3–1.2)
Total Protein: 6.7 g/dL (ref 6.0–8.3)

## 2013-12-25 LAB — GLUCOSE, CAPILLARY
Glucose-Capillary: 236 mg/dL — ABNORMAL HIGH (ref 70–99)
Glucose-Capillary: 230 mg/dL — ABNORMAL HIGH (ref 70–99)
Glucose-Capillary: 229 mg/dL — ABNORMAL HIGH (ref 70–99)

## 2013-12-25 LAB — CBC
HCT: 40.6 % (ref 36.0–46.0)
Hemoglobin: 13.6 g/dL (ref 12.0–15.0)
MCH: 28.6 pg (ref 26.0–34.0)
MCHC: 33.5 g/dL (ref 30.0–36.0)
MCV: 85.5 fL (ref 78.0–100.0)
PLATELETS: 303 10*3/uL (ref 150–400)
RBC: 4.75 MIL/uL (ref 3.87–5.11)
RDW: 12.6 % (ref 11.5–15.5)
WBC: 7.6 10*3/uL (ref 4.0–10.5)

## 2013-12-25 MED ORDER — INSULIN ASPART 100 UNIT/ML ~~LOC~~ SOLN: 3.0000 [IU] | Freq: Three times a day (TID) | SUBCUTANEOUS | Status: DC

## 2013-12-25 MED ORDER — INSULIN DETEMIR 100 UNIT/ML ~~LOC~~ SOLN: 15.0000 [IU] | Freq: Every day | SUBCUTANEOUS | Status: DC

## 2013-12-25 MED ORDER — CLOPIDOGREL BISULFATE 75 MG PO TABS: 75.0000 mg | ORAL_TABLET | Freq: Every day | ORAL | Status: DC

## 2013-12-25 NOTE — Progress Notes (Signed)
Physical Therapy Treatment Patient Details Name: Carrie Moses MRN: [id number] DOB: 1940-07-09 Today's Date: 12/25/2013    History of Present Illness Carrie Moses is a 73 y.o. 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    PT Comments    Progressing steadily.  Emphasis on balance and gait stability with less reliance on assistive devices while giving pt a safe environment to let go of her assistance.  Follow Up Recommendations  SNF (heading to [facility])     Equipment Recommendations  None recommended by PT    Recommendations for Other Services       Precautions / Restrictions Precautions Precautions: Fall    Mobility  Bed Mobility               General bed mobility comments: up in chair already  Transfers Overall transfer level: Needs assistance Equipment used: Rolling walker (2 wheeled);1 person hand held assist Transfers: Sit to/from Stand Sit to Stand: Min assist;Min guard (depending on surface chair vs toilet)         General transfer comment: cues for technique and hand placement  Ambulation/Gait Ambulation/Gait assistance: Min assist;Mod assist Ambulation Distance (Feet): 220 Feet Assistive device: Rolling walker (2 wheeled);1 person hand held assist   Gait velocity: slow, but pushed herself faster with cuing   General Gait Details: Worked on quality as before, but with less reliance on rails or RW.  Pt's ataxia increases with less assist as expected, but in general, pts gait quality improved up to point of fatigue.   Stairs            Wheelchair Mobility    Modified Rankin (Stroke Patients Only) Modified Rankin (Stroke Patients Only) Pre-Morbid Rankin Score: No symptoms Modified Rankin: Moderately severe disability     Balance     Sitting balance-Leahy Scale: Fair       Standing  balance-Leahy Scale: Fair                      Cognition Arousal/Alertness: Awake/alert Behavior During Therapy: WFL for tasks assessed/performed Overall Cognitive Status: Within Functional Limits for tasks assessed                      Exercises      General Comments        Pertinent Vitals/Pain Pain Assessment: No/denies pain    Home Living                      Prior Function            PT Goals (current goals can now be found in the care plan section) Acute Rehab PT Goals Patient Stated Goal: get better PT Goal Formulation: With patient Time For Goal Achievement: 01/06/14 Potential to Achieve Goals: Good Progress towards PT goals: Progressing toward goals    Frequency  Min 4X/week    PT Plan Current plan remains appropriate    Co-evaluation             End of Session   Activity Tolerance: Patient tolerated treatment well Patient left: in chair;with call bell/phone within reach     Time: 1534-1559 PT Time Calculation (min): 25 min  Charges:  $Gait Training: 8-22 mins $Therapeutic Activity: 8-22 mins  G Codes:      Revella Shelton, Tessie Fass 12/25/2013, 4:11 PM 12/25/2013  Donnella Sham, [place] [phone]  (pager)

## 2013-12-25 NOTE — Clinical Social Work Placement (Addendum)
Clinical Social Work Department CLINICAL SOCIAL WORK PLACEMENT NOTE 12/25/2013  Patient:  Carrie Moses, Carrie Moses  Account Number:  [id number] Admit date:  12/22/2013  Clinical Social Worker:  Delrae Sawyers  Date/time:  12/25/2013 10:58 AM  Clinical Social Work is seeking post-discharge placement for this patient at the following level of care:   [place]   (*CSW will update this form in Epic as items are completed)   12/25/2013  Patient/family provided with [place] Department of Clinical Social Work's list of facilities offering this level of care within the geographic area requested by the patient (or if unable, by the patient's family).  12/25/2013  Patient/family informed of their freedom to choose among providers that offer the needed level of care, that participate in Medicare, Medicaid or managed care program needed by the patient, have an available bed and are willing to accept the patient.  12/25/2013  Patient/family informed of [facility]' ownership interest in [facility], as well as of the fact that they are under no obligation to receive care at this facility.  PASARR submitted to EDS on  PASARR number received on   FL2 transmitted to all facilities in geographic area requested by pt/family on  12/25/2013 FL2 transmitted to all facilities within larger geographic area on   Patient informed that his/her managed care company has contracts with or will negotiate with  certain facilities, including the following:     Patient/family informed of bed offers received:  12/25/2013 Patient chooses bed at [facility] Physician recommends and patient chooses bed at    Patient to be transferred to [facility] on 12/25/2013 Patient to be transferred to facility by family Patient and family notified of transfer on 12/25/2013 Name of family member notified:  Pt stated she will notify pt's son, no need for CSW to notify.  The following  physician request were entered in Epic:   Additional Comments: PASARR previously existing.  Lubertha Sayres, MSW, [facility] Licensed Clinical Social Worker [phone] and [phone]

## 2013-12-25 NOTE — Progress Notes (Signed)
Pt able to self administer insulin with no problem. Pt self administered both am and noon insulin coverage. Francis Gaines Sim Choquette RN.

## 2013-12-25 NOTE — Progress Notes (Signed)
Occupational Therapy Treatment Patient Details Name: Carrie Moses MRN: [id number] DOB: [birth year] Today's Date: 12/25/2013    History of present illness Carrie Moses is a 73 y.o. 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   OT comments  Pt educated on RT UE HEP working on reaching control and placement of objects in containers. Pt demonstrates oral care with min guard (A). Pt using BIL UE to control RT UE. Ot to continue to follow acutely   Follow Up Recommendations  SNF    Equipment Recommendations  Other (comment) (defer)    Recommendations for Other Services      Precautions / Restrictions Precautions Precautions: Fall       Mobility Bed Mobility               General bed mobility comments: in chair  Transfers Overall transfer level: Needs assistance Equipment used: Rolling walker (2 wheeled) Transfers: Sit to/from Stand Sit to Stand: Min assist         General transfer comment: cues for technique and hand placement    Balance Overall balance assessment: Needs assistance   Sitting balance-Leahy Scale: Fair       Standing balance-Leahy Scale: Fair                     ADL       Grooming: Wash/dry hands;Min Dispensing optician: Minimal assistance;Ambulation;RW;Regular Museum/gallery exhibitions officer and Hygiene: Min guard;Sit to/from stand       Functional mobility during ADLs: Minimal assistance;Rolling walker General ADL Comments: Pt educated on reaching with RT UE with thumb up to help control ataxic movement. Pt participated in reaching tasks. Pt completed grooming and toileting task.       Vision                     Perception     Praxis      Cognition   Behavior During Therapy: WFL for tasks assessed/performed Overall  Cognitive Status: Within Functional Limits for tasks assessed                       Extremity/Trunk Assessment               Exercises     Shoulder Instructions       General Comments      Pertinent Vitals/ Pain       Pain Assessment: No/denies pain  Home Living                                          Prior Functioning/Environment              Frequency Min 2X/week     Progress Toward Goals  OT Goals(current goals can now be found in the care plan section)  Progress towards OT goals: Progressing toward goals  Acute Rehab OT Goals Patient Stated Goal: get better OT Goal Formulation: With patient Time For Goal Achievement: 12/30/13 Potential to Achieve Goals: Good ADL Goals Pt Will Perform Lower Body Dressing: with modified independence;sit to/from stand Pt Will Transfer  to Toilet: with modified independence;ambulating;grab bars Pt Will Perform Toileting - Clothing Manipulation and hygiene: with modified independence;sit to/from stand Additional ADL Goal #1: Pt will be independent with HEP for RUE to increase gross and fine motor coordination.   Plan Discharge plan needs to be updated    Co-evaluation                 End of Session Equipment Utilized During Treatment: Gait belt;Rolling walker   Activity Tolerance Patient tolerated treatment well   Patient Left in chair;with call bell/phone within reach   Nurse Communication Mobility status;Precautions        Time: 3202-3343 OT Time Calculation (min): 19 min  Charges: OT General Charges $OT Visit: 1 Procedure OT Treatments $Self Care/Home Management : 8-22 mins  Peri Maris 12/25/2013, 4:33 PM Pager: [phone]

## 2013-12-25 NOTE — Clinical Social Work Note (Signed)
Discharge summary faxed to [facility]. Discharge packet complete and placed on pt's shadow chart. Pt updated at bedside regarding discharge. Pt stated pt will be transported via personal vehicle (pt's son), and pt will be notifying pt's son regarding discharge. CSW received [facility] SNF authorization [id number]).  [place] [phone]  CSW has completed EMS (PTAR) transportation form in the event pt's son is unable to provide transportation. RN please call PTAR at [phone] if pt requires EMS transportation.  Lubertha Sayres, MSW, [facility] Licensed Clinical Social Worker [phone] and [phone]

## 2013-12-25 NOTE — Progress Notes (Signed)
Results for MISTIE, ADNEY (MRN [id number]) as of 12/25/2013 11:43  Ref. Range 12/24/2013 06:37 12/24/2013 12:02 12/24/2013 16:47 12/24/2013 22:09 12/25/2013 07:13  Glucose-Capillary Latest Range: 70-99 mg/dL 211 (H) 282 (H) 220 (H) 212 (H) 236 (H)  CBGs continue to be greater than 180 mg/dl.  Recommend adding Novolog 3-4 units TID with meals if postprandial blood sugars remain greater than 180 mg/dl and if patient eating at least 50% of meals.  Will continue to follow.  Harvel Ricks RN BSN CDE

## 2013-12-25 NOTE — Progress Notes (Signed)
Pt dinner insulin coverage given; pt self administered medication; pt given her home insulin starter kit for Levemir and Novolog. Pt discharge to SNF. P.Amo Slayter Moorhouse RN.

## 2013-12-25 NOTE — Care Management Note (Addendum)
  Page 1 of 1   12/25/2013     11:09:47 AM CARE MANAGEMENT NOTE 12/25/2013  Patient:  Carrie Moses, Carrie Moses   Account Number:  [id number]  Date Initiated:  12/23/2013  Documentation initiated by:  Olga Coaster  Subjective/Objective Assessment:   ADMITTED FOR STROKE WORKUP     Action/Plan:   CM FOLLOWING FOR DCP   Anticipated DC Date:  12/27/2013   Anticipated DC Plan:  [place]  CM consult      Choice offered to / List presented to:             Status of service:  In process, will continue to follow Medicare Important Message given?  YES (If response is "NO", the following Medicare IM given date fields will be blank) Date Medicare IM given:  12/25/2013 Medicare IM given by:  Lorne Skeens Date Additional Medicare IM given:   Additional Medicare IM given by:    Discharge Disposition:    Per UR Regulation:  Reviewed for med. necessity/level of care/duration of stay  If discussed at [place] of Stay Meetings, dates discussed:    Comments:  12/25/13 [place], MSN, CM- Medicare IM letter provided.

## 2013-12-25 NOTE — Progress Notes (Signed)
Pt A&O x4; pt discharge education and instructions completed with pt and son at bedside. Pt handed print out education on stroke;pt discharge to SNF and report called off to nurse Summer at [facility]. Pt IV and telemetry removed; pt transported off unit via wheelchair with son and belongings at side. P.Amo Zalika Tieszen RN.

## 2013-12-25 NOTE — Discharge Summary (Signed)
Physician Discharge Summary  Carrie Moses MRN: [id number] DOB/AGE: 1940-05-11 73 y.o.  PCP: No primary provider on file.   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 Erlinda Hong in clinic in about 2 months     Medication List    STOP taking these medications       aspirin EC 81 MG tablet     glipiZIDE-metformin 5-500 MG per tablet  Commonly known as:  METAGLIP      TAKE these medications       clopidogrel 75 MG tablet  Commonly known as:  PLAVIX  Take 1 tablet (75 mg total) by mouth daily.     fenofibrate 160 MG tablet  Take 160 mg by mouth daily.     insulin aspart 100 UNIT/ML injection  Commonly known as:  NOVOLOG  Inject 3 Units into the skin 3 (three) times daily before meals.     insulin detemir 100 UNIT/ML injection  Commonly known as:  LEVEMIR  Inject 0.15 mLs (15 Units total) into the skin at bedtime.     losartan 50 MG tablet  Commonly known as:  COZAAR  Take 50 mg by mouth daily.        Discharge Condition: Stable   Disposition: Final discharge disposition not confirmed   Consults: Neurology   Significant Diagnostic Studies: Dg Chest 2 View  12/23/2013   CLINICAL DATA:  Recent stroke  EXAM: CHEST  2 VIEW  COMPARISON:  None.  FINDINGS: Cardiac shadow is at the upper limits of normal in size. The lungs are well-aerated without focal infiltrate. No acute bony abnormality is seen.  IMPRESSION: No acute abnormality noted.   Electronically Signed   By: Inez Catalina M.D.   On: 12/23/2013 08:02   Ct Head (brain) Wo Contrast  12/22/2013   ADDENDUM REPORT: 12/22/2013 18:08  ADDENDUM: Critical Value/emergent results were called by  telephone at the time of interpretation on 12/22/2013 at 6:07 pm to Dr. Doy Mince, who verbally acknowledged these results.   Electronically Signed   By: Skipper Cliche M.D.   On: 12/22/2013 18:08   12/22/2013   CLINICAL DATA:  Code stroke, right-sided weakness  EXAM: CT HEAD WITHOUT CONTRAST  TECHNIQUE: Contiguous axial images were obtained from the base of the skull through the vertex without intravenous contrast.  COMPARISON:  None.  FINDINGS: Mild age-related atrophy. No evidence of vascular territory infarct, mass, hemorrhage, or extra-axial fluid. No hydrocephalus. Prominent perivascular spaces in the medial temporal lobes bilaterally, normal variant. Calvarium intact.  IMPRESSION: No acute finding  Electronically Signed: By: Skipper Cliche M.D. On: 12/22/2013 18:04   Mr Brain Wo Contrast  12/23/2013   CLINICAL DATA:  Patient awoke from a nap with dysmetria and sensory deficits. Acute posterior circulation event suspected. Stroke risk factors include diabetes, hypertension, and hypercholesterolemia.  EXAM: MRI HEAD WITHOUT CONTRAST  MRA HEAD WITHOUT CONTRAST  TECHNIQUE: Multiplanar, multiecho pulse sequences of the brain and surrounding structures were obtained without intravenous contrast. Angiographic images of the head were obtained using MRA technique without contrast.  COMPARISON:  CT head 12/22/2013.  FINDINGS: MRI HEAD FINDINGS  The patient was unable to remain motionless for the exam. Small or subtle lesions  could be overlooked.  There is an acute posterior circulation infarct affecting the RIGHT superior vermis and RIGHT superior cerebellar hemisphere. No mass effect on the fourth ventricle. No other areas of acute infarction.  No hemorrhage, mass lesion, hydrocephalus, or extra-axial fluid. Normal for age cerebral volume. Mild subcortical and periventricular T2 and FLAIR hyperintensities, likely chronic microvascular ischemic change. Flow voids are maintained in the carotid, basilar, and LEFT  vertebral arteries. Poor flow related enhancement in the RIGHT vertebral. BILATERAL cataract extraction. Negative sinuses and orbits. No midline abnormality. Compared with the prior CT, the infarct is difficult to visualize.  MRA HEAD FINDINGS  The internal carotid arteries are widely patent. The basilar artery is widely patent with the LEFT vertebral as the dominant contributor. There is a suspected focal 75% stenosis of the RIGHT vertebral distal V3/proximal V4 segment. The distal RIGHT V4 vertebral segment is hypoplastic versus chronically disease above the origin of the RIGHT PICA.  There is no proximal flow reducing stenosis of the anterior or middle cerebral arteries. No MCA branch occlusion. Both posterior cerebral arteries are widely patent.  There is no appreciable disease in the superior cerebellar artery visualized segments. No visible cerebellar branch occlusion. No intracranial aneurysm.  IMPRESSION: Acute posterior circulation and in the RIGHT superior vermis and RIGHT superior cerebellar hemisphere. No mass effect on the fourth ventricle or hemorrhage.  Suspected 75% stenosis of the RIGHT vertebral distal V3/proximal V4 segment.  No proximal superior cerebellar artery disease on the RIGHT.   Electronically Signed   By: Rolla Flatten M.D.   On: 12/23/2013 10:05   Mr Jodene Nam Head/brain Wo Cm  12/23/2013   CLINICAL DATA:  Patient awoke from a nap with dysmetria and sensory deficits. Acute posterior circulation event suspected. Stroke risk factors include diabetes, hypertension, and hypercholesterolemia.  EXAM: MRI HEAD WITHOUT CONTRAST  MRA HEAD WITHOUT CONTRAST  TECHNIQUE: Multiplanar, multiecho pulse sequences of the brain and surrounding structures were obtained without intravenous contrast. Angiographic images of the head were obtained using MRA technique without contrast.  COMPARISON:  CT head 12/22/2013.  FINDINGS: MRI HEAD FINDINGS  The patient was unable to remain motionless for the exam. Small or  subtle lesions could be overlooked.  There is an acute posterior circulation infarct affecting the RIGHT superior vermis and RIGHT superior cerebellar hemisphere. No mass effect on the fourth ventricle. No other areas of acute infarction.  No hemorrhage, mass lesion, hydrocephalus, or extra-axial fluid. Normal for age cerebral volume. Mild subcortical and periventricular T2 and FLAIR hyperintensities, likely chronic microvascular ischemic change. Flow voids are maintained in the carotid, basilar, and LEFT vertebral arteries. Poor flow related enhancement in the RIGHT vertebral. BILATERAL cataract extraction. Negative sinuses and orbits. No midline abnormality. Compared with the prior CT, the infarct is difficult to visualize.  MRA HEAD FINDINGS  The internal carotid arteries are widely patent. The basilar artery is widely patent with the LEFT vertebral as the dominant contributor. There is a suspected focal 75% stenosis of the RIGHT vertebral distal V3/proximal V4 segment. The distal RIGHT V4 vertebral segment is hypoplastic versus chronically disease above the origin of the RIGHT PICA.  There is no proximal flow reducing stenosis of the anterior or middle cerebral arteries. No MCA branch occlusion. Both posterior cerebral arteries are widely patent.  There is no appreciable disease in the superior cerebellar artery visualized segments. No visible cerebellar branch occlusion. No intracranial aneurysm.  IMPRESSION: Acute posterior circulation and in the RIGHT superior vermis and RIGHT superior cerebellar hemisphere.  No mass effect on the fourth ventricle or hemorrhage.  Suspected 75% stenosis of the RIGHT vertebral distal V3/proximal V4 segment.  No proximal superior cerebellar artery disease on the RIGHT.   Electronically Signed   By: Rolla Flatten M.D.   On: 12/23/2013 10:05       Microbiology: No results found for this or any previous visit (from the past 240 hour(s)).   Labs: Results for orders placed  during the hospital encounter of 12/22/13 (from the past 48 hour(s))  GLUCOSE, CAPILLARY     Status: Abnormal   Collection Time    12/23/13  4:48 PM      Result Value Ref Range   Glucose-Capillary 319 (*) 70 - 99 mg/dL   Comment 1 Notify RN     Comment 2 Documented in Chart    GLUCOSE, CAPILLARY     Status: Abnormal   Collection Time    12/23/13  9:18 PM      Result Value Ref Range   Glucose-Capillary 267 (*) 70 - 99 mg/dL   Comment 1 Notify RN     Comment 2 Documented in Chart    GLUCOSE, CAPILLARY     Status: Abnormal   Collection Time    12/24/13  6:37 AM      Result Value Ref Range   Glucose-Capillary 211 (*) 70 - 99 mg/dL   Comment 1 Notify RN     Comment 2 Documented in Chart    GLUCOSE, CAPILLARY     Status: Abnormal   Collection Time    12/24/13 12:02 PM      Result Value Ref Range   Glucose-Capillary 282 (*) 70 - 99 mg/dL   Comment 1 Notify RN     Comment 2 Documented in Chart    GLUCOSE, CAPILLARY     Status: Abnormal   Collection Time    12/24/13  4:47 PM      Result Value Ref Range   Glucose-Capillary 220 (*) 70 - 99 mg/dL   Comment 1 Documented in Chart     Comment 2 Notify RN    GLUCOSE, CAPILLARY     Status: Abnormal   Collection Time    12/24/13 10:09 PM      Result Value Ref Range   Glucose-Capillary 212 (*) 70 - 99 mg/dL   Comment 1 Documented in Chart     Comment 2 Notify RN    COMPREHENSIVE METABOLIC PANEL     Status: Abnormal   Collection Time    12/25/13  6:30 AM      Result Value Ref Range   Sodium 140  137 - 147 mEq/L   Potassium 4.4  3.7 - 5.3 mEq/L   Chloride 99  96 - 112 mEq/L   CO2 28  19 - 32 mEq/L   Glucose, Bld 230 (*) 70 - 99 mg/dL   BUN 13  6 - 23 mg/dL   Creatinine, Ser 0.53  0.50 - 1.10 mg/dL   Calcium 9.1  8.4 - 10.5 mg/dL   Total Protein 6.7  6.0 - 8.3 g/dL   Albumin 3.5  3.5 - 5.2 g/dL   AST 19  0 - 37 U/L   ALT 14  0 - 35 U/L   Alkaline Phosphatase 74  39 - 117 U/L   Total Bilirubin 0.4  0.3 - 1.2 mg/dL   GFR calc non  Af Amer >90  >90 mL/min   GFR calc Af Amer >90  >90 mL/min  Comment: (NOTE)     The eGFR has been calculated using the CKD EPI equation.     This calculation has not been validated in all clinical situations.     eGFR's persistently <90 mL/min signify possible Chronic Kidney     Disease.   Anion gap 13  5 - 15  CBC     Status: None   Collection Time    12/25/13  6:30 AM      Result Value Ref Range   WBC 7.6  4.0 - 10.5 K/uL   RBC 4.75  3.87 - 5.11 MIL/uL   Hemoglobin 13.6  12.0 - 15.0 g/dL   HCT 40.6  36.0 - 46.0 %   MCV 85.5  78.0 - 100.0 fL   MCH 28.6  26.0 - 34.0 pg   MCHC 33.5  30.0 - 36.0 g/dL   RDW 12.6  11.5 - 15.5 %   Platelets 303  150 - 400 K/uL  GLUCOSE, CAPILLARY     Status: Abnormal   Collection Time    12/25/13  7:13 AM      Result Value Ref Range   Glucose-Capillary 236 (*) 70 - 99 mg/dL   Comment 1 Notify RN     Comment 2 Documented in Chart    GLUCOSE, CAPILLARY     Status: Abnormal   Collection Time    12/25/13 11:58 AM      Result Value Ref Range   Glucose-Capillary 230 (*) 70 - 99 mg/dL   Comment 1 Notify RN     Comment 2 Documented in Chart       HPI  Carrie Moses is an 73 y.o. female who reports waking up normal today. Was able to make breakfast and get her grandson off to school. She sat down in her rocking chair at about 0830 12/22/2013 and eventually took a nap in the chair. When she awakened she went to go to the bathroom and could not make her right leg work. Later in the day her son was made awake. They called her PCP who referred her to the ED. EMS was called and the patient as brought to the ED as a code stroke. Initial NIHSS of 5. Patient was not administered TPA secondary to delay in arrival. She was admitted for further evaluation and treatment.   HOSPITAL COURSE: Imaging confirms a right superior cerebellar and vermis infarct Ct Head (brain) Wo Contrast  12/22/2013 No acute finding  Mr Brain Wo Contrast  12/23/2013 Acute posterior  circulation and in the RIGHT superior vermis and RIGHT superior cerebellar hemisphere. No mass effect on the fourth ventricle or hemorrhage.  Mr Jodene Nam Head/brain Wo Cm  12/23/2013 Suspected 75% stenosis of the RIGHT vertebral distal V3/proximal V4 segment. No proximal superior cerebellar artery disease on the RIGHT.  Dg Chest 2 View  12/23/2013 No acute abnormality noted.  CUS - Bilateral: 1-39% ICA stenosis. Vertebral artery flow is antegrade.  2D echo - Left ventricle: The cavity size was normal. Wall thickness was increased in a pattern of mild LVH. Systolic function was vigorous. The estimated ejection fraction was in the range of 65% to 70%. Wall motion was normal; there were no regional wall motion abnormalities. Doppler parameters are consistent with abnormal left ventricular relaxation (grade 1 diastolic dysfunction). - Right atrium: The atrium was mildly dilated.    physical therapy recommended SNF Patient denied by CIR   Hypertension  Home meds: cozaar.  Permissive hypertension BP <220/120 for 24-48 hours and then  normalize within 5-7 days.  BP 137-166/59-117 past 24h  Stable Hyperlipidemia  Home meds: Fenofibrate. No statin indicated at this time because of low LDL.  LDL 66  At LDL goal < 70 for diabetics Diabetes  Home meds: Glipizide, metformin.  HgbA1c 9.5  Started insulin in hospital  DM not in good control  Goal < 7.0 Other Stroke Risk Factors  Advanced age Obesity, Body mass index is 39.31 kg/(m^2).  Family hx cerebral aneurysm (father) Hx of murmur years ago Other Pertinent History  GB in Jul 08, 1969 s/p trach  Recent death of her husband    Discharge Exam:   Blood pressure 142/62, pulse 91, temperature 98.5 F (36.9 C), temperature source Oral, resp. rate 18, height 5' 2"  (1.575 m), weight 97.523 kg (215 lb), SpO2 96.00%.           Follow-up Information   Follow up with Xu,Jindong, MD In 2 months. (stroke clinic)    Specialty:  Neurology   Contact  information:   [address] [place] [address] [phone]       Follow up with pcp. Schedule an appointment as soon as possible for a visit in 1 week.      SignedReyne Dumas 12/25/2013, 12:21 PM

## 2013-12-25 NOTE — Clinical Social Work Psychosocial (Signed)
Clinical Social Work Department BRIEF PSYCHOSOCIAL ASSESSMENT 12/25/2013  Patient:  Carrie Moses, Carrie Moses     Account Number:  [id number]     Admit date:  12/22/2013  Clinical Social Worker:  Delrae Sawyers  Date/Time:  12/25/2013 10:53 AM  Referred by:  Physician  Date Referred:  12/25/2013 Referred for  SNF Placement   Other Referral:   none.   Interview type:  Patient Other interview type:   none.    PSYCHOSOCIAL DATA Living Status:  FAMILY Admitted from facility:   Level of care:   Primary support name:  April (no last name provided) Primary support relationship to patient:  CHILD, ADULT Degree of support available:   Strong support system, per pt.    CURRENT CONCERNS Current Concerns  Post-Acute Placement   Other Concerns:   none.    SOCIAL WORK ASSESSMENT / PLAN CSW met with pt at bedside to discuss SNF as back-up plan to CIR. Pt stated she was agreeable to SNF as a back-up plan, and preferred [facility] SNF. Pt stated she is from home with daughter (April) and grandson.    CSW to continue to follow and assist with discharge planning needs.   Assessment/plan status:  Psychosocial Support/Ongoing Assessment of Needs Other assessment/ plan:   none.   Information/referral to community resources:   [facility] bed offers.    PATIENT'S/FAMILY'S RESPONSE TO PLAN OF CARE: Pt understanding and agreeable to CSW plan of care. Pt expressed no further questions or concerns.       Lubertha Sayres, MSW, [facility] Licensed Clinical Social Worker [phone] and [phone]

## 2014-07-03 DIAGNOSIS — Z79899 Other long term (current) drug therapy: Secondary | ICD-10-CM | POA: Diagnosis not present

## 2014-07-03 DIAGNOSIS — E1165 Type 2 diabetes mellitus with hyperglycemia: Secondary | ICD-10-CM | POA: Diagnosis not present

## 2014-07-03 DIAGNOSIS — Z1389 Encounter for screening for other disorder: Secondary | ICD-10-CM | POA: Diagnosis not present

## 2014-07-03 DIAGNOSIS — E559 Vitamin D deficiency, unspecified: Secondary | ICD-10-CM | POA: Diagnosis not present

## 2014-07-03 DIAGNOSIS — E781 Pure hyperglyceridemia: Secondary | ICD-10-CM | POA: Diagnosis not present

## 2014-07-03 DIAGNOSIS — I639 Cerebral infarction, unspecified: Secondary | ICD-10-CM | POA: Diagnosis not present

## 2014-07-03 DIAGNOSIS — I10 Essential (primary) hypertension: Secondary | ICD-10-CM | POA: Diagnosis not present

## 2014-07-22 DIAGNOSIS — H35363 Drusen (degenerative) of macula, bilateral: Secondary | ICD-10-CM | POA: Diagnosis not present

## 2014-07-22 DIAGNOSIS — H26493 Other secondary cataract, bilateral: Secondary | ICD-10-CM | POA: Diagnosis not present

## 2014-07-22 DIAGNOSIS — Z961 Presence of intraocular lens: Secondary | ICD-10-CM | POA: Diagnosis not present

## 2014-07-22 DIAGNOSIS — E119 Type 2 diabetes mellitus without complications: Secondary | ICD-10-CM | POA: Diagnosis not present

## 2014-09-28 ENCOUNTER — Other Ambulatory Visit: Payer: Self-pay

## 2014-09-28 DIAGNOSIS — Z1231 Encounter for screening mammogram for malignant neoplasm of breast: Secondary | ICD-10-CM

## 2014-10-23 ENCOUNTER — Ambulatory Visit
Admission: RE | Admit: 2014-10-23 | Discharge: 2014-10-23 | Disposition: A | Discharge status: Home / Self Care | Payer: Commercial Managed Care - HMO | Source: Ambulatory Visit

## 2014-10-23 DIAGNOSIS — Z1231 Encounter for screening mammogram for malignant neoplasm of breast: Secondary | ICD-10-CM

## 2014-11-18 DIAGNOSIS — Z9181 History of falling: Secondary | ICD-10-CM | POA: Diagnosis not present

## 2014-11-18 DIAGNOSIS — E781 Pure hyperglyceridemia: Secondary | ICD-10-CM | POA: Diagnosis not present

## 2014-11-18 DIAGNOSIS — Z124 Encounter for screening for malignant neoplasm of cervix: Secondary | ICD-10-CM | POA: Diagnosis not present

## 2014-11-18 DIAGNOSIS — E1165 Type 2 diabetes mellitus with hyperglycemia: Secondary | ICD-10-CM | POA: Diagnosis not present

## 2014-11-18 DIAGNOSIS — Z1389 Encounter for screening for other disorder: Secondary | ICD-10-CM | POA: Diagnosis not present

## 2014-11-18 DIAGNOSIS — Z0000 Encounter for general adult medical examination without abnormal findings: Secondary | ICD-10-CM | POA: Diagnosis not present

## 2014-11-18 DIAGNOSIS — Z139 Encounter for screening, unspecified: Secondary | ICD-10-CM | POA: Diagnosis not present

## 2014-11-18 DIAGNOSIS — E559 Vitamin D deficiency, unspecified: Secondary | ICD-10-CM | POA: Diagnosis not present

## 2014-11-18 DIAGNOSIS — I10 Essential (primary) hypertension: Secondary | ICD-10-CM | POA: Diagnosis not present

## 2015-04-05 DIAGNOSIS — I10 Essential (primary) hypertension: Secondary | ICD-10-CM | POA: Diagnosis not present

## 2015-04-05 DIAGNOSIS — E1169 Type 2 diabetes mellitus with other specified complication: Secondary | ICD-10-CM | POA: Diagnosis not present

## 2015-04-05 DIAGNOSIS — E781 Pure hyperglyceridemia: Secondary | ICD-10-CM | POA: Diagnosis not present

## 2015-04-05 DIAGNOSIS — E1165 Type 2 diabetes mellitus with hyperglycemia: Secondary | ICD-10-CM | POA: Diagnosis not present

## 2015-04-05 DIAGNOSIS — I639 Cerebral infarction, unspecified: Secondary | ICD-10-CM | POA: Diagnosis not present

## 2015-04-05 DIAGNOSIS — Z6838 Body mass index (BMI) 38.0-38.9, adult: Secondary | ICD-10-CM | POA: Diagnosis not present

## 2015-04-05 DIAGNOSIS — E559 Vitamin D deficiency, unspecified: Secondary | ICD-10-CM | POA: Diagnosis not present

## 2015-04-05 DIAGNOSIS — Z79899 Other long term (current) drug therapy: Secondary | ICD-10-CM | POA: Diagnosis not present

## 2015-05-20 DIAGNOSIS — Z79899 Other long term (current) drug therapy: Secondary | ICD-10-CM | POA: Diagnosis not present

## 2015-08-04 DIAGNOSIS — E781 Pure hyperglyceridemia: Secondary | ICD-10-CM | POA: Diagnosis not present

## 2015-08-04 DIAGNOSIS — E2839 Other primary ovarian failure: Secondary | ICD-10-CM | POA: Diagnosis not present

## 2015-08-04 DIAGNOSIS — Z9181 History of falling: Secondary | ICD-10-CM | POA: Diagnosis not present

## 2015-08-04 DIAGNOSIS — Z79899 Other long term (current) drug therapy: Secondary | ICD-10-CM | POA: Diagnosis not present

## 2015-08-04 DIAGNOSIS — E559 Vitamin D deficiency, unspecified: Secondary | ICD-10-CM | POA: Diagnosis not present

## 2015-08-04 DIAGNOSIS — M810 Age-related osteoporosis without current pathological fracture: Secondary | ICD-10-CM | POA: Diagnosis not present

## 2015-08-04 DIAGNOSIS — I10 Essential (primary) hypertension: Secondary | ICD-10-CM | POA: Diagnosis not present

## 2015-08-04 DIAGNOSIS — E1165 Type 2 diabetes mellitus with hyperglycemia: Secondary | ICD-10-CM | POA: Diagnosis not present

## 2015-08-04 DIAGNOSIS — Z6841 Body Mass Index (BMI) 40.0 and over, adult: Secondary | ICD-10-CM | POA: Diagnosis not present

## 2015-08-09 ENCOUNTER — Other Ambulatory Visit: Payer: Self-pay | Admitting: Family Medicine

## 2015-08-09 DIAGNOSIS — E2839 Other primary ovarian failure: Secondary | ICD-10-CM

## 2015-08-25 ENCOUNTER — Other Ambulatory Visit: Payer: Commercial Managed Care - HMO

## 2015-08-25 ENCOUNTER — Ambulatory Visit
Admission: RE | Admit: 2015-08-25 | Discharge: 2015-08-25 | Disposition: A | Discharge status: Home / Self Care | Payer: Commercial Managed Care - HMO | Source: Ambulatory Visit | Attending: Family Medicine | Admitting: Family Medicine

## 2015-08-25 DIAGNOSIS — E2839 Other primary ovarian failure: Secondary | ICD-10-CM

## 2015-08-25 DIAGNOSIS — M810 Age-related osteoporosis without current pathological fracture: Secondary | ICD-10-CM | POA: Diagnosis not present

## 2015-08-25 DIAGNOSIS — Z780 Asymptomatic menopausal state: Secondary | ICD-10-CM | POA: Diagnosis not present

## 2015-10-20 ENCOUNTER — Other Ambulatory Visit: Payer: Self-pay | Admitting: Family Medicine

## 2015-10-20 DIAGNOSIS — Z1231 Encounter for screening mammogram for malignant neoplasm of breast: Secondary | ICD-10-CM

## 2015-11-02 ENCOUNTER — Ambulatory Visit
Admission: RE | Admit: 2015-11-02 | Discharge: 2015-11-02 | Disposition: A | Discharge status: Home / Self Care | Payer: Commercial Managed Care - HMO | Source: Ambulatory Visit | Attending: Family Medicine | Admitting: Family Medicine

## 2015-11-02 DIAGNOSIS — Z1231 Encounter for screening mammogram for malignant neoplasm of breast: Secondary | ICD-10-CM | POA: Diagnosis not present

## 2015-12-20 DIAGNOSIS — Z1389 Encounter for screening for other disorder: Secondary | ICD-10-CM | POA: Diagnosis not present

## 2015-12-20 DIAGNOSIS — Z6841 Body Mass Index (BMI) 40.0 and over, adult: Secondary | ICD-10-CM | POA: Diagnosis not present

## 2015-12-20 DIAGNOSIS — M810 Age-related osteoporosis without current pathological fracture: Secondary | ICD-10-CM | POA: Diagnosis not present

## 2015-12-20 DIAGNOSIS — I10 Essential (primary) hypertension: Secondary | ICD-10-CM | POA: Diagnosis not present

## 2015-12-20 DIAGNOSIS — Z139 Encounter for screening, unspecified: Secondary | ICD-10-CM | POA: Diagnosis not present

## 2015-12-20 DIAGNOSIS — E1165 Type 2 diabetes mellitus with hyperglycemia: Secondary | ICD-10-CM | POA: Diagnosis not present

## 2015-12-20 DIAGNOSIS — E781 Pure hyperglyceridemia: Secondary | ICD-10-CM | POA: Diagnosis not present

## 2015-12-20 DIAGNOSIS — E559 Vitamin D deficiency, unspecified: Secondary | ICD-10-CM | POA: Diagnosis not present

## 2015-12-28 DIAGNOSIS — I6350 Cerebral infarction due to unspecified occlusion or stenosis of unspecified cerebral artery: Secondary | ICD-10-CM | POA: Diagnosis not present

## 2015-12-28 DIAGNOSIS — Z961 Presence of intraocular lens: Secondary | ICD-10-CM | POA: Diagnosis not present

## 2015-12-28 DIAGNOSIS — E119 Type 2 diabetes mellitus without complications: Secondary | ICD-10-CM | POA: Diagnosis not present

## 2015-12-28 DIAGNOSIS — H353131 Nonexudative age-related macular degeneration, bilateral, early dry stage: Secondary | ICD-10-CM | POA: Diagnosis not present

## 2015-12-28 DIAGNOSIS — H26493 Other secondary cataract, bilateral: Secondary | ICD-10-CM | POA: Diagnosis not present

## 2016-01-05 DIAGNOSIS — Z961 Presence of intraocular lens: Secondary | ICD-10-CM | POA: Diagnosis not present

## 2016-01-05 DIAGNOSIS — H26492 Other secondary cataract, left eye: Secondary | ICD-10-CM | POA: Diagnosis not present

## 2016-04-28 DIAGNOSIS — Z79899 Other long term (current) drug therapy: Secondary | ICD-10-CM | POA: Diagnosis not present

## 2016-04-28 DIAGNOSIS — I639 Cerebral infarction, unspecified: Secondary | ICD-10-CM | POA: Diagnosis not present

## 2016-04-28 DIAGNOSIS — E1165 Type 2 diabetes mellitus with hyperglycemia: Secondary | ICD-10-CM | POA: Diagnosis not present

## 2016-04-28 DIAGNOSIS — E559 Vitamin D deficiency, unspecified: Secondary | ICD-10-CM | POA: Diagnosis not present

## 2016-04-28 DIAGNOSIS — M25571 Pain in right ankle and joints of right foot: Secondary | ICD-10-CM | POA: Diagnosis not present

## 2016-04-28 DIAGNOSIS — M810 Age-related osteoporosis without current pathological fracture: Secondary | ICD-10-CM | POA: Diagnosis not present

## 2016-04-28 DIAGNOSIS — I10 Essential (primary) hypertension: Secondary | ICD-10-CM | POA: Diagnosis not present

## 2016-04-28 DIAGNOSIS — Z6841 Body Mass Index (BMI) 40.0 and over, adult: Secondary | ICD-10-CM | POA: Diagnosis not present

## 2016-08-17 DIAGNOSIS — Z9181 History of falling: Secondary | ICD-10-CM | POA: Diagnosis not present

## 2016-08-17 DIAGNOSIS — M810 Age-related osteoporosis without current pathological fracture: Secondary | ICD-10-CM | POA: Diagnosis not present

## 2016-08-17 DIAGNOSIS — I10 Essential (primary) hypertension: Secondary | ICD-10-CM | POA: Diagnosis not present

## 2016-08-17 DIAGNOSIS — E1165 Type 2 diabetes mellitus with hyperglycemia: Secondary | ICD-10-CM | POA: Diagnosis not present

## 2016-08-17 DIAGNOSIS — Z6841 Body Mass Index (BMI) 40.0 and over, adult: Secondary | ICD-10-CM | POA: Diagnosis not present

## 2016-08-17 DIAGNOSIS — Z1211 Encounter for screening for malignant neoplasm of colon: Secondary | ICD-10-CM | POA: Diagnosis not present

## 2016-08-18 DIAGNOSIS — E1165 Type 2 diabetes mellitus with hyperglycemia: Secondary | ICD-10-CM | POA: Diagnosis not present

## 2016-10-27 ENCOUNTER — Other Ambulatory Visit: Payer: Self-pay | Admitting: Family Medicine

## 2016-10-27 DIAGNOSIS — Z1231 Encounter for screening mammogram for malignant neoplasm of breast: Secondary | ICD-10-CM

## 2016-11-20 DIAGNOSIS — E1165 Type 2 diabetes mellitus with hyperglycemia: Secondary | ICD-10-CM | POA: Diagnosis not present

## 2016-11-20 DIAGNOSIS — I10 Essential (primary) hypertension: Secondary | ICD-10-CM | POA: Diagnosis not present

## 2016-11-20 DIAGNOSIS — E781 Pure hyperglyceridemia: Secondary | ICD-10-CM | POA: Diagnosis not present

## 2016-11-20 DIAGNOSIS — Z6841 Body Mass Index (BMI) 40.0 and over, adult: Secondary | ICD-10-CM | POA: Diagnosis not present

## 2016-11-20 DIAGNOSIS — I639 Cerebral infarction, unspecified: Secondary | ICD-10-CM | POA: Diagnosis not present

## 2016-11-22 ENCOUNTER — Ambulatory Visit: Payer: Commercial Managed Care - HMO

## 2016-12-21 ENCOUNTER — Ambulatory Visit
Admission: RE | Admit: 2016-12-21 | Discharge: 2016-12-21 | Disposition: A | Discharge status: Home / Self Care | Payer: Commercial Managed Care - HMO | Source: Ambulatory Visit | Attending: Family Medicine | Admitting: Family Medicine

## 2016-12-21 DIAGNOSIS — Z1231 Encounter for screening mammogram for malignant neoplasm of breast: Secondary | ICD-10-CM

## 2017-01-26 DIAGNOSIS — H31091 Other chorioretinal scars, right eye: Secondary | ICD-10-CM | POA: Diagnosis not present

## 2017-01-26 DIAGNOSIS — Z961 Presence of intraocular lens: Secondary | ICD-10-CM | POA: Diagnosis not present

## 2017-01-26 DIAGNOSIS — E119 Type 2 diabetes mellitus without complications: Secondary | ICD-10-CM | POA: Diagnosis not present

## 2017-01-26 DIAGNOSIS — D3131 Benign neoplasm of right choroid: Secondary | ICD-10-CM | POA: Diagnosis not present

## 2017-05-02 DIAGNOSIS — Z1331 Encounter for screening for depression: Secondary | ICD-10-CM | POA: Diagnosis not present

## 2017-05-02 DIAGNOSIS — E1165 Type 2 diabetes mellitus with hyperglycemia: Secondary | ICD-10-CM | POA: Diagnosis not present

## 2017-05-02 DIAGNOSIS — Z139 Encounter for screening, unspecified: Secondary | ICD-10-CM | POA: Diagnosis not present

## 2017-05-02 DIAGNOSIS — I639 Cerebral infarction, unspecified: Secondary | ICD-10-CM | POA: Diagnosis not present

## 2017-05-02 DIAGNOSIS — I10 Essential (primary) hypertension: Secondary | ICD-10-CM | POA: Diagnosis not present

## 2017-05-02 DIAGNOSIS — Z6841 Body Mass Index (BMI) 40.0 and over, adult: Secondary | ICD-10-CM | POA: Diagnosis not present

## 2017-05-02 DIAGNOSIS — E781 Pure hyperglyceridemia: Secondary | ICD-10-CM | POA: Diagnosis not present

## 2017-05-02 DIAGNOSIS — E559 Vitamin D deficiency, unspecified: Secondary | ICD-10-CM | POA: Diagnosis not present

## 2017-10-01 DIAGNOSIS — I10 Essential (primary) hypertension: Secondary | ICD-10-CM | POA: Diagnosis not present

## 2017-10-01 DIAGNOSIS — E559 Vitamin D deficiency, unspecified: Secondary | ICD-10-CM | POA: Diagnosis not present

## 2017-10-01 DIAGNOSIS — I639 Cerebral infarction, unspecified: Secondary | ICD-10-CM | POA: Diagnosis not present

## 2017-10-01 DIAGNOSIS — Z6841 Body Mass Index (BMI) 40.0 and over, adult: Secondary | ICD-10-CM | POA: Diagnosis not present

## 2017-10-01 DIAGNOSIS — E781 Pure hyperglyceridemia: Secondary | ICD-10-CM | POA: Diagnosis not present

## 2017-10-01 DIAGNOSIS — E1165 Type 2 diabetes mellitus with hyperglycemia: Secondary | ICD-10-CM | POA: Diagnosis not present

## 2018-01-10 ENCOUNTER — Other Ambulatory Visit: Payer: Self-pay | Admitting: Family Medicine

## 2018-01-10 DIAGNOSIS — Z1231 Encounter for screening mammogram for malignant neoplasm of breast: Secondary | ICD-10-CM

## 2018-02-01 DIAGNOSIS — E559 Vitamin D deficiency, unspecified: Secondary | ICD-10-CM | POA: Diagnosis not present

## 2018-02-01 DIAGNOSIS — Z1339 Encounter for screening examination for other mental health and behavioral disorders: Secondary | ICD-10-CM | POA: Diagnosis not present

## 2018-02-01 DIAGNOSIS — Z1211 Encounter for screening for malignant neoplasm of colon: Secondary | ICD-10-CM | POA: Diagnosis not present

## 2018-02-01 DIAGNOSIS — E781 Pure hyperglyceridemia: Secondary | ICD-10-CM | POA: Diagnosis not present

## 2018-02-01 DIAGNOSIS — Z9181 History of falling: Secondary | ICD-10-CM | POA: Diagnosis not present

## 2018-02-01 DIAGNOSIS — I10 Essential (primary) hypertension: Secondary | ICD-10-CM | POA: Diagnosis not present

## 2018-02-01 DIAGNOSIS — E1165 Type 2 diabetes mellitus with hyperglycemia: Secondary | ICD-10-CM | POA: Diagnosis not present

## 2018-02-01 DIAGNOSIS — I639 Cerebral infarction, unspecified: Secondary | ICD-10-CM | POA: Diagnosis not present

## 2018-02-15 ENCOUNTER — Ambulatory Visit
Admission: RE | Admit: 2018-02-15 | Discharge: 2018-02-15 | Disposition: A | Discharge status: Home / Self Care | Payer: Commercial Managed Care - HMO | Source: Ambulatory Visit | Attending: Family Medicine | Admitting: Family Medicine

## 2018-02-15 DIAGNOSIS — Z1231 Encounter for screening mammogram for malignant neoplasm of breast: Secondary | ICD-10-CM

## 2018-03-06 DIAGNOSIS — D3131 Benign neoplasm of right choroid: Secondary | ICD-10-CM | POA: Diagnosis not present

## 2018-03-06 DIAGNOSIS — E119 Type 2 diabetes mellitus without complications: Secondary | ICD-10-CM | POA: Diagnosis not present

## 2018-03-06 DIAGNOSIS — H35371 Puckering of macula, right eye: Secondary | ICD-10-CM | POA: Diagnosis not present

## 2018-03-06 DIAGNOSIS — Z961 Presence of intraocular lens: Secondary | ICD-10-CM | POA: Diagnosis not present

## 2018-03-06 DIAGNOSIS — H31091 Other chorioretinal scars, right eye: Secondary | ICD-10-CM | POA: Diagnosis not present

## 2018-03-06 DIAGNOSIS — H3540 Unspecified peripheral retinal degeneration: Secondary | ICD-10-CM | POA: Diagnosis not present

## 2018-06-07 DIAGNOSIS — E1165 Type 2 diabetes mellitus with hyperglycemia: Secondary | ICD-10-CM | POA: Diagnosis not present

## 2018-06-07 DIAGNOSIS — Z2821 Immunization not carried out because of patient refusal: Secondary | ICD-10-CM | POA: Diagnosis not present

## 2018-06-07 DIAGNOSIS — Z8669 Personal history of other diseases of the nervous system and sense organs: Secondary | ICD-10-CM | POA: Diagnosis not present

## 2018-06-07 DIAGNOSIS — I10 Essential (primary) hypertension: Secondary | ICD-10-CM | POA: Diagnosis not present

## 2018-06-07 DIAGNOSIS — E559 Vitamin D deficiency, unspecified: Secondary | ICD-10-CM | POA: Diagnosis not present

## 2018-06-07 DIAGNOSIS — I639 Cerebral infarction, unspecified: Secondary | ICD-10-CM | POA: Diagnosis not present

## 2018-06-07 DIAGNOSIS — E781 Pure hyperglyceridemia: Secondary | ICD-10-CM | POA: Diagnosis not present

## 2018-06-07 DIAGNOSIS — Z1331 Encounter for screening for depression: Secondary | ICD-10-CM | POA: Diagnosis not present

## 2018-10-11 DIAGNOSIS — G8191 Hemiplegia, unspecified affecting right dominant side: Secondary | ICD-10-CM | POA: Diagnosis not present

## 2018-10-11 DIAGNOSIS — Z6841 Body Mass Index (BMI) 40.0 and over, adult: Secondary | ICD-10-CM | POA: Diagnosis not present

## 2018-10-11 DIAGNOSIS — I639 Cerebral infarction, unspecified: Secondary | ICD-10-CM | POA: Diagnosis not present

## 2018-10-11 DIAGNOSIS — E781 Pure hyperglyceridemia: Secondary | ICD-10-CM | POA: Diagnosis not present

## 2018-10-11 DIAGNOSIS — Z8669 Personal history of other diseases of the nervous system and sense organs: Secondary | ICD-10-CM | POA: Diagnosis not present

## 2018-10-11 DIAGNOSIS — E559 Vitamin D deficiency, unspecified: Secondary | ICD-10-CM | POA: Diagnosis not present

## 2018-10-11 DIAGNOSIS — E1165 Type 2 diabetes mellitus with hyperglycemia: Secondary | ICD-10-CM | POA: Diagnosis not present

## 2018-10-11 DIAGNOSIS — I10 Essential (primary) hypertension: Secondary | ICD-10-CM | POA: Diagnosis not present

## 2019-02-05 ENCOUNTER — Other Ambulatory Visit: Payer: Self-pay | Admitting: Internal Medicine

## 2019-02-05 DIAGNOSIS — Z1231 Encounter for screening mammogram for malignant neoplasm of breast: Secondary | ICD-10-CM

## 2019-02-10 DIAGNOSIS — Z139 Encounter for screening, unspecified: Secondary | ICD-10-CM | POA: Diagnosis not present

## 2019-02-10 DIAGNOSIS — I639 Cerebral infarction, unspecified: Secondary | ICD-10-CM | POA: Diagnosis not present

## 2019-02-10 DIAGNOSIS — E1165 Type 2 diabetes mellitus with hyperglycemia: Secondary | ICD-10-CM | POA: Diagnosis not present

## 2019-02-10 DIAGNOSIS — E781 Pure hyperglyceridemia: Secondary | ICD-10-CM | POA: Diagnosis not present

## 2019-02-10 DIAGNOSIS — Z9181 History of falling: Secondary | ICD-10-CM | POA: Diagnosis not present

## 2019-02-10 DIAGNOSIS — I10 Essential (primary) hypertension: Secondary | ICD-10-CM | POA: Diagnosis not present

## 2019-02-10 DIAGNOSIS — G8191 Hemiplegia, unspecified affecting right dominant side: Secondary | ICD-10-CM | POA: Diagnosis not present

## 2019-02-10 DIAGNOSIS — E559 Vitamin D deficiency, unspecified: Secondary | ICD-10-CM | POA: Diagnosis not present

## 2019-03-13 DIAGNOSIS — Z961 Presence of intraocular lens: Secondary | ICD-10-CM | POA: Diagnosis not present

## 2019-03-13 DIAGNOSIS — H35371 Puckering of macula, right eye: Secondary | ICD-10-CM | POA: Diagnosis not present

## 2019-03-13 DIAGNOSIS — D3131 Benign neoplasm of right choroid: Secondary | ICD-10-CM | POA: Diagnosis not present

## 2019-03-13 DIAGNOSIS — E119 Type 2 diabetes mellitus without complications: Secondary | ICD-10-CM | POA: Diagnosis not present

## 2019-03-13 DIAGNOSIS — H31091 Other chorioretinal scars, right eye: Secondary | ICD-10-CM | POA: Diagnosis not present

## 2019-03-26 ENCOUNTER — Other Ambulatory Visit: Payer: Self-pay

## 2019-03-26 ENCOUNTER — Ambulatory Visit
Admission: RE | Admit: 2019-03-26 | Discharge: 2019-03-26 | Disposition: A | Discharge status: Home / Self Care | Payer: Medicare HMO | Source: Ambulatory Visit | Attending: Internal Medicine | Admitting: Internal Medicine

## 2019-03-26 DIAGNOSIS — Z1231 Encounter for screening mammogram for malignant neoplasm of breast: Secondary | ICD-10-CM | POA: Diagnosis not present

## 2019-05-22 DIAGNOSIS — Z1331 Encounter for screening for depression: Secondary | ICD-10-CM | POA: Diagnosis not present

## 2019-05-22 DIAGNOSIS — Z0000 Encounter for general adult medical examination without abnormal findings: Secondary | ICD-10-CM | POA: Diagnosis not present

## 2019-05-22 DIAGNOSIS — Z9181 History of falling: Secondary | ICD-10-CM | POA: Diagnosis not present

## 2019-05-22 DIAGNOSIS — Z6841 Body Mass Index (BMI) 40.0 and over, adult: Secondary | ICD-10-CM | POA: Diagnosis not present

## 2019-05-22 DIAGNOSIS — E785 Hyperlipidemia, unspecified: Secondary | ICD-10-CM | POA: Diagnosis not present

## 2019-07-01 DIAGNOSIS — I10 Essential (primary) hypertension: Secondary | ICD-10-CM | POA: Diagnosis not present

## 2019-07-01 DIAGNOSIS — Z79899 Other long term (current) drug therapy: Secondary | ICD-10-CM | POA: Diagnosis not present

## 2019-07-01 DIAGNOSIS — Z6841 Body Mass Index (BMI) 40.0 and over, adult: Secondary | ICD-10-CM | POA: Diagnosis not present

## 2019-07-01 DIAGNOSIS — E781 Pure hyperglyceridemia: Secondary | ICD-10-CM | POA: Diagnosis not present

## 2019-07-01 DIAGNOSIS — I639 Cerebral infarction, unspecified: Secondary | ICD-10-CM | POA: Diagnosis not present

## 2019-07-01 DIAGNOSIS — G8191 Hemiplegia, unspecified affecting right dominant side: Secondary | ICD-10-CM | POA: Diagnosis not present

## 2019-07-01 DIAGNOSIS — B372 Candidiasis of skin and nail: Secondary | ICD-10-CM | POA: Diagnosis not present

## 2019-07-01 DIAGNOSIS — E1165 Type 2 diabetes mellitus with hyperglycemia: Secondary | ICD-10-CM | POA: Diagnosis not present

## 2019-11-03 DIAGNOSIS — Z6841 Body Mass Index (BMI) 40.0 and over, adult: Secondary | ICD-10-CM | POA: Diagnosis not present

## 2019-11-03 DIAGNOSIS — E1129 Type 2 diabetes mellitus with other diabetic kidney complication: Secondary | ICD-10-CM | POA: Diagnosis not present

## 2019-11-03 DIAGNOSIS — I10 Essential (primary) hypertension: Secondary | ICD-10-CM | POA: Diagnosis not present

## 2019-11-03 DIAGNOSIS — E785 Hyperlipidemia, unspecified: Secondary | ICD-10-CM | POA: Diagnosis not present

## 2019-11-03 DIAGNOSIS — G8191 Hemiplegia, unspecified affecting right dominant side: Secondary | ICD-10-CM | POA: Diagnosis not present

## 2019-11-03 DIAGNOSIS — I639 Cerebral infarction, unspecified: Secondary | ICD-10-CM | POA: Diagnosis not present

## 2019-11-03 DIAGNOSIS — E1169 Type 2 diabetes mellitus with other specified complication: Secondary | ICD-10-CM | POA: Diagnosis not present

## 2019-11-03 DIAGNOSIS — R809 Proteinuria, unspecified: Secondary | ICD-10-CM | POA: Diagnosis not present

## 2020-01-19 DIAGNOSIS — Z6841 Body Mass Index (BMI) 40.0 and over, adult: Secondary | ICD-10-CM | POA: Diagnosis not present

## 2020-01-19 DIAGNOSIS — G8191 Hemiplegia, unspecified affecting right dominant side: Secondary | ICD-10-CM | POA: Diagnosis not present

## 2020-01-19 DIAGNOSIS — Z139 Encounter for screening, unspecified: Secondary | ICD-10-CM | POA: Diagnosis not present

## 2020-01-19 DIAGNOSIS — R5381 Other malaise: Secondary | ICD-10-CM | POA: Diagnosis not present

## 2020-01-19 DIAGNOSIS — I639 Cerebral infarction, unspecified: Secondary | ICD-10-CM | POA: Diagnosis not present

## 2020-03-01 ENCOUNTER — Other Ambulatory Visit: Payer: Self-pay | Admitting: Family Medicine

## 2020-03-01 DIAGNOSIS — Z1231 Encounter for screening mammogram for malignant neoplasm of breast: Secondary | ICD-10-CM

## 2020-03-05 DIAGNOSIS — E785 Hyperlipidemia, unspecified: Secondary | ICD-10-CM | POA: Diagnosis not present

## 2020-03-05 DIAGNOSIS — I10 Essential (primary) hypertension: Secondary | ICD-10-CM | POA: Diagnosis not present

## 2020-03-05 DIAGNOSIS — G8191 Hemiplegia, unspecified affecting right dominant side: Secondary | ICD-10-CM | POA: Diagnosis not present

## 2020-03-05 DIAGNOSIS — R809 Proteinuria, unspecified: Secondary | ICD-10-CM | POA: Diagnosis not present

## 2020-03-05 DIAGNOSIS — M2041 Other hammer toe(s) (acquired), right foot: Secondary | ICD-10-CM | POA: Diagnosis not present

## 2020-03-05 DIAGNOSIS — I639 Cerebral infarction, unspecified: Secondary | ICD-10-CM | POA: Diagnosis not present

## 2020-03-05 DIAGNOSIS — E1129 Type 2 diabetes mellitus with other diabetic kidney complication: Secondary | ICD-10-CM | POA: Diagnosis not present

## 2020-03-05 DIAGNOSIS — Z9189 Other specified personal risk factors, not elsewhere classified: Secondary | ICD-10-CM | POA: Diagnosis not present

## 2020-03-31 DIAGNOSIS — H35371 Puckering of macula, right eye: Secondary | ICD-10-CM | POA: Diagnosis not present

## 2020-03-31 DIAGNOSIS — Z961 Presence of intraocular lens: Secondary | ICD-10-CM | POA: Diagnosis not present

## 2020-03-31 DIAGNOSIS — E113291 Type 2 diabetes mellitus with mild nonproliferative diabetic retinopathy without macular edema, right eye: Secondary | ICD-10-CM | POA: Diagnosis not present

## 2020-03-31 DIAGNOSIS — D3131 Benign neoplasm of right choroid: Secondary | ICD-10-CM | POA: Diagnosis not present

## 2020-03-31 DIAGNOSIS — I639 Cerebral infarction, unspecified: Secondary | ICD-10-CM | POA: Diagnosis not present

## 2020-03-31 DIAGNOSIS — H31091 Other chorioretinal scars, right eye: Secondary | ICD-10-CM | POA: Diagnosis not present

## 2020-04-09 ENCOUNTER — Ambulatory Visit
Admission: RE | Admit: 2020-04-09 | Discharge: 2020-04-09 | Disposition: A | Discharge status: Home / Self Care | Payer: Medicare HMO | Source: Ambulatory Visit | Attending: Family Medicine | Admitting: Family Medicine

## 2020-04-09 ENCOUNTER — Other Ambulatory Visit: Payer: Self-pay

## 2020-04-09 DIAGNOSIS — Z1231 Encounter for screening mammogram for malignant neoplasm of breast: Secondary | ICD-10-CM | POA: Diagnosis not present

## 2020-07-02 DIAGNOSIS — E1129 Type 2 diabetes mellitus with other diabetic kidney complication: Secondary | ICD-10-CM | POA: Diagnosis not present

## 2020-07-02 DIAGNOSIS — E785 Hyperlipidemia, unspecified: Secondary | ICD-10-CM | POA: Diagnosis not present

## 2020-07-02 DIAGNOSIS — I639 Cerebral infarction, unspecified: Secondary | ICD-10-CM | POA: Diagnosis not present

## 2020-07-02 DIAGNOSIS — I10 Essential (primary) hypertension: Secondary | ICD-10-CM | POA: Diagnosis not present

## 2020-07-02 DIAGNOSIS — R300 Dysuria: Secondary | ICD-10-CM | POA: Diagnosis not present

## 2020-07-02 DIAGNOSIS — R809 Proteinuria, unspecified: Secondary | ICD-10-CM | POA: Diagnosis not present

## 2020-07-02 DIAGNOSIS — Z1331 Encounter for screening for depression: Secondary | ICD-10-CM | POA: Diagnosis not present

## 2020-07-02 DIAGNOSIS — Z79899 Other long term (current) drug therapy: Secondary | ICD-10-CM | POA: Diagnosis not present

## 2020-07-02 DIAGNOSIS — G8191 Hemiplegia, unspecified affecting right dominant side: Secondary | ICD-10-CM | POA: Diagnosis not present

## 2020-07-02 DIAGNOSIS — Z6841 Body Mass Index (BMI) 40.0 and over, adult: Secondary | ICD-10-CM | POA: Diagnosis not present

## 2020-09-02 ENCOUNTER — Inpatient Hospital Stay (HOSPITAL_COMMUNITY)
Admission: EM | Admit: 2020-09-02 | Discharge: 2020-09-06 | DRG: 177 | Disposition: A | Discharge status: Home Health | Payer: Medicare HMO | Attending: Internal Medicine | Admitting: Internal Medicine

## 2020-09-02 ENCOUNTER — Other Ambulatory Visit: Payer: Self-pay

## 2020-09-02 ENCOUNTER — Emergency Department (HOSPITAL_COMMUNITY): Payer: Medicare HMO

## 2020-09-02 DIAGNOSIS — J9601 Acute respiratory failure with hypoxia: Secondary | ICD-10-CM | POA: Diagnosis present

## 2020-09-02 DIAGNOSIS — I4891 Unspecified atrial fibrillation: Secondary | ICD-10-CM | POA: Diagnosis not present

## 2020-09-02 DIAGNOSIS — E1169 Type 2 diabetes mellitus with other specified complication: Secondary | ICD-10-CM | POA: Diagnosis not present

## 2020-09-02 DIAGNOSIS — U071 COVID-19: Secondary | ICD-10-CM | POA: Diagnosis not present

## 2020-09-02 DIAGNOSIS — I517 Cardiomegaly: Secondary | ICD-10-CM | POA: Diagnosis not present

## 2020-09-02 DIAGNOSIS — J189 Pneumonia, unspecified organism: Secondary | ICD-10-CM | POA: Diagnosis not present

## 2020-09-02 DIAGNOSIS — E7800 Pure hypercholesterolemia, unspecified: Secondary | ICD-10-CM | POA: Diagnosis present

## 2020-09-02 DIAGNOSIS — Z6837 Body mass index (BMI) 37.0-37.9, adult: Secondary | ICD-10-CM | POA: Diagnosis not present

## 2020-09-02 DIAGNOSIS — Z28310 Unvaccinated for covid-19: Secondary | ICD-10-CM

## 2020-09-02 DIAGNOSIS — A4189 Other specified sepsis: Secondary | ICD-10-CM | POA: Diagnosis present

## 2020-09-02 DIAGNOSIS — I499 Cardiac arrhythmia, unspecified: Secondary | ICD-10-CM

## 2020-09-02 DIAGNOSIS — R6520 Severe sepsis without septic shock: Secondary | ICD-10-CM | POA: Diagnosis not present

## 2020-09-02 DIAGNOSIS — I5030 Unspecified diastolic (congestive) heart failure: Secondary | ICD-10-CM | POA: Diagnosis not present

## 2020-09-02 DIAGNOSIS — E669 Obesity, unspecified: Secondary | ICD-10-CM | POA: Diagnosis not present

## 2020-09-02 DIAGNOSIS — Z7902 Long term (current) use of antithrombotics/antiplatelets: Secondary | ICD-10-CM | POA: Diagnosis not present

## 2020-09-02 DIAGNOSIS — Z91018 Allergy to other foods: Secondary | ICD-10-CM | POA: Diagnosis not present

## 2020-09-02 DIAGNOSIS — J811 Chronic pulmonary edema: Secondary | ICD-10-CM | POA: Diagnosis not present

## 2020-09-02 DIAGNOSIS — Z8673 Personal history of transient ischemic attack (TIA), and cerebral infarction without residual deficits: Secondary | ICD-10-CM

## 2020-09-02 DIAGNOSIS — E785 Hyperlipidemia, unspecified: Secondary | ICD-10-CM | POA: Diagnosis present

## 2020-09-02 DIAGNOSIS — E119 Type 2 diabetes mellitus without complications: Secondary | ICD-10-CM | POA: Diagnosis present

## 2020-09-02 DIAGNOSIS — Z79899 Other long term (current) drug therapy: Secondary | ICD-10-CM

## 2020-09-02 DIAGNOSIS — R0602 Shortness of breath: Secondary | ICD-10-CM | POA: Diagnosis not present

## 2020-09-02 DIAGNOSIS — A419 Sepsis, unspecified organism: Secondary | ICD-10-CM | POA: Diagnosis not present

## 2020-09-02 DIAGNOSIS — R0600 Dyspnea, unspecified: Secondary | ICD-10-CM | POA: Diagnosis not present

## 2020-09-02 DIAGNOSIS — I480 Paroxysmal atrial fibrillation: Secondary | ICD-10-CM | POA: Diagnosis not present

## 2020-09-02 DIAGNOSIS — I110 Hypertensive heart disease with heart failure: Secondary | ICD-10-CM | POA: Diagnosis present

## 2020-09-02 DIAGNOSIS — J1282 Pneumonia due to coronavirus disease 2019: Secondary | ICD-10-CM | POA: Diagnosis not present

## 2020-09-02 DIAGNOSIS — Z794 Long term (current) use of insulin: Secondary | ICD-10-CM

## 2020-09-02 DIAGNOSIS — R000 Tachycardia, unspecified: Secondary | ICD-10-CM | POA: Diagnosis not present

## 2020-09-02 DIAGNOSIS — R069 Unspecified abnormalities of breathing: Secondary | ICD-10-CM | POA: Diagnosis not present

## 2020-09-02 DIAGNOSIS — I10 Essential (primary) hypertension: Secondary | ICD-10-CM | POA: Diagnosis present

## 2020-09-02 LAB — COMPREHENSIVE METABOLIC PANEL
ALT: 15 U/L (ref 0–44)
AST: 19 U/L (ref 15–41)
Albumin: 3.5 g/dL (ref 3.5–5.0)
Alkaline Phosphatase: 46 U/L (ref 38–126)
Anion gap: 9 (ref 5–15)
BUN: 24 mg/dL — ABNORMAL HIGH (ref 8–23)
CO2: 25 mmol/L (ref 22–32)
Calcium: 8.6 mg/dL — ABNORMAL LOW (ref 8.9–10.3)
Chloride: 101 mmol/L (ref 98–111)
Creatinine, Ser: 0.84 mg/dL (ref 0.44–1.00)
GFR, Estimated: >60 mL/min (ref >60)
Glucose, Bld: 224 mg/dL — ABNORMAL HIGH (ref 70–99)
Potassium: 4.1 mmol/L (ref 3.5–5.1)
Sodium: 135 mmol/L (ref 135–145)
Total Bilirubin: 0.9 mg/dL (ref 0.3–1.2)
Total Protein: 6.2 g/dL — ABNORMAL LOW (ref 6.5–8.1)

## 2020-09-02 LAB — CBC WITH DIFFERENTIAL/PLATELET
Abs Immature Granulocytes: 0.05 10*3/uL (ref 0.00–0.07)
Basophils Absolute: 0 10*3/uL (ref 0.0–0.1)
Basophils Relative: 0 %
Eosinophils Absolute: 0 10*3/uL (ref 0.0–0.5)
Eosinophils Relative: 0 %
HCT: 41.7 % (ref 36.0–46.0)
Hemoglobin: 13.5 g/dL (ref 12.0–15.0)
Immature Granulocytes: 0 %
Lymphocytes Relative: 4 %
Lymphs Abs: 0.5 10*3/uL — ABNORMAL LOW (ref 0.7–4.0)
MCH: 30.3 pg (ref 26.0–34.0)
MCHC: 32.4 g/dL (ref 30.0–36.0)
MCV: 93.5 fL (ref 80.0–100.0)
Monocytes Absolute: 0.5 10*3/uL (ref 0.1–1.0)
Monocytes Relative: 5 %
Neutro Abs: 10.2 10*3/uL — ABNORMAL HIGH (ref 1.7–7.7)
Neutrophils Relative %: 91 %
Platelets: 225 10*3/uL (ref 150–400)
RBC: 4.46 MIL/uL (ref 3.87–5.11)
RDW: 12.1 % (ref 11.5–15.5)
WBC: 11.3 10*3/uL — ABNORMAL HIGH (ref 4.0–10.5)
nRBC: 0 % (ref 0.0–0.2)

## 2020-09-02 LAB — RESP PANEL BY RT-PCR (FLU A&B, COVID) ARPGX2
Influenza A by PCR: NEGATIVE
Influenza B by PCR: NEGATIVE
SARS Coronavirus 2 by RT PCR: POSITIVE — AB

## 2020-09-02 LAB — TRIGLYCERIDES: Triglycerides: 61 mg/dL (ref <150)

## 2020-09-02 LAB — LACTATE DEHYDROGENASE: LDH: 133 U/L (ref 98–192)

## 2020-09-02 LAB — C-REACTIVE PROTEIN: CRP: 0.6 mg/dL (ref <1.0)

## 2020-09-02 LAB — D-DIMER, QUANTITATIVE: D-Dimer, Quant: 0.6 ug/mL-FEU — ABNORMAL HIGH (ref 0.00–0.50)

## 2020-09-02 LAB — PROCALCITONIN: Procalcitonin: <0.1 ng/mL

## 2020-09-02 LAB — FERRITIN: Ferritin: 36 ng/mL (ref 11–307)

## 2020-09-02 LAB — LACTIC ACID, PLASMA
Lactic Acid, Venous: 1.1 mmol/L (ref 0.5–1.9)
Lactic Acid, Venous: 0.9 mmol/L (ref 0.5–1.9)

## 2020-09-02 LAB — FIBRINOGEN: Fibrinogen: 414 mg/dL (ref 210–475)

## 2020-09-02 MED ORDER — SODIUM CHLORIDE 0.9 % IV SOLN: 250.0000 mL | INTRAVENOUS | Status: DC | PRN

## 2020-09-02 MED ORDER — DILTIAZEM HCL 25 MG/5ML IV SOLN
10.0000 mg | Freq: Once | INTRAVENOUS | Status: AC
  Administered 2020-09-02: 10 mg via INTRAVENOUS
  Filled 2020-09-02: qty 5

## 2020-09-02 MED ORDER — DEXAMETHASONE SODIUM PHOSPHATE 10 MG/ML IJ SOLN
6.0000 mg | Freq: Once | INTRAMUSCULAR | Status: AC
  Administered 2020-09-02: 6 mg via INTRAVENOUS
  Filled 2020-09-02: qty 1

## 2020-09-02 MED ORDER — SODIUM CHLORIDE 0.9 % IV SOLN
100.0000 mg | Freq: Every day | INTRAVENOUS | Status: AC
  Administered 2020-09-03 – 2020-09-06 (×4): 100 mg via INTRAVENOUS
  Filled 2020-09-02 (×4): qty 20

## 2020-09-02 MED ORDER — SODIUM CHLORIDE 0.9% FLUSH
3.0000 mL | Freq: Two times a day (BID) | INTRAVENOUS | Status: DC
  Administered 2020-09-02 – 2020-09-06 (×8): 3 mL via INTRAVENOUS

## 2020-09-02 MED ORDER — SODIUM CHLORIDE 0.9 % IV SOLN
200.0000 mg | Freq: Once | INTRAVENOUS | Status: AC
  Administered 2020-09-02: 200 mg via INTRAVENOUS
  Filled 2020-09-02: qty 40

## 2020-09-02 MED ORDER — SODIUM CHLORIDE 0.9% FLUSH: 3.0000 mL | INTRAVENOUS | Status: DC | PRN

## 2020-09-02 NOTE — ED Notes (Signed)
Patient's son called and undated at this time.

## 2020-09-02 NOTE — ED Provider Notes (Signed)
[facility] EMERGENCY DEPARTMENT Provider Note   CSN: [id number] Arrival date & time: 09/02/20  1823     History Chief Complaint  Patient presents with  . Shortness of Breath    Carrie Moses is a 80 y.o. female.  HPI   Patient presented to the ED for evaluation of shortness of breath and cough.  Patient states her symptoms started today.  She states she is primarily having issues with her coughing and does not feel that short of breath.  Patient states she was vomiting although when I asked her more about it she states she has been vomiting after coughing episodes.  Patient states her daughter did a home COVID test and it was positive.  EMS was called.  She was noted to have an oxygen saturation of 77% on room air.  She was started on 2 L nasal cannula.  Patient was also noted to be tachycardic with an irregular heart rhythm.  Patient denies any abdominal pain.  No chest pain.  She states she did have a fever at home today.  She has not been vaccinated for COVID-19.  Patient states she has an allergy to the vaccine  Past Medical History:  Diagnosis Date  . Diabetes mellitus without complication ([place])   . Hypercholesteremia   . Hypertension     Patient Active Problem List   Diagnosis Date Noted  . Unspecified cerebral artery occlusion with cerebral infarction 12/22/2013  . HTN (hypertension) 12/22/2013  . DM (diabetes mellitus) ([place]) 12/22/2013  . Hyperlipidemia 12/22/2013  . CVA (cerebral infarction) 12/22/2013    Past Surgical History:  Procedure Laterality Date  . BREAST BIOPSY Right pt unsure   benign     OB History   No obstetric history on file.     Family History  Problem Relation Age of Onset  . Breast cancer Mother     Social History   Tobacco Use  . Smoking status: Never Smoker  Substance Use Topics  . Alcohol use: No  . Drug use: No    Home Medications Prior to Admission medications   Medication Sig Start Date End Date  Taking? Authorizing Provider  clopidogrel (PLAVIX) 75 MG tablet Take 1 tablet (75 mg total) by mouth daily. 12/25/13   Reyne Dumas, MD  fenofibrate 160 MG tablet Take 160 mg by mouth daily.    [provider]  insulin aspart (NOVOLOG) 100 UNIT/ML injection Inject 3 Units into the skin 3 (three) times daily before meals. 12/25/13   Reyne Dumas, MD  insulin detemir (LEVEMIR) 100 UNIT/ML injection Inject 0.15 mLs (15 Units total) into the skin at bedtime. 12/25/13   Reyne Dumas, MD  losartan (COZAAR) 50 MG tablet Take 50 mg by mouth daily.    [provider]    Allergies    Other  Review of Systems   Review of Systems  All other systems reviewed and are negative.   Physical Exam Updated Vital Signs BP 115/76   Pulse (!) 111   Temp 99.4 F (37.4 C) (Oral)   Resp (!) 23   Ht 1.6 m (5\' 3" )   Wt 95.3 kg   SpO2 93%   BMI 37.20 kg/m   Physical Exam Vitals and nursing note reviewed.  Constitutional:      Appearance: She is well-developed. She is not diaphoretic.  HENT:     Head: Normocephalic and atraumatic.     Right Ear: External ear normal.     Left Ear:  External ear normal.  Eyes:     General: No scleral icterus.       Right eye: No discharge.        Left eye: No discharge.     Conjunctiva/sclera: Conjunctivae normal.  Neck:     Trachea: No tracheal deviation.  Cardiovascular:     Rate and Rhythm: Tachycardia present. Rhythm regularly irregular.  Pulmonary:     Effort: Pulmonary effort is normal. No respiratory distress.     Breath sounds: Normal breath sounds. No stridor. No wheezing or rales.  Abdominal:     General: Bowel sounds are normal. There is no distension.     Palpations: Abdomen is soft.     Tenderness: There is no abdominal tenderness. There is no guarding or rebound.  Musculoskeletal:        General: No tenderness.     Cervical back: Neck supple.  Skin:    General: Skin is warm and dry.     Findings: No rash.  Neurological:      Mental Status: She is alert.     Cranial Nerves: No cranial nerve deficit (no facial droop, extraocular movements intact, no slurred speech).     Sensory: No sensory deficit.     Motor: No abnormal muscle tone or seizure activity.     Coordination: Coordination normal.     ED Results / Procedures / Treatments   Labs (all labs ordered are listed, but only abnormal results are displayed) Labs Reviewed  RESP PANEL BY RT-PCR (FLU A&B, COVID) ARPGX2 - Abnormal; Notable for the following components:      Result Value   SARS Coronavirus 2 by RT PCR POSITIVE (*)    All other components within normal limits  CBC WITH DIFFERENTIAL/PLATELET - Abnormal; Notable for the following components:   WBC 11.3 (*)    Neutro Abs 10.2 (*)    Lymphs Abs 0.5 (*)    All other components within normal limits  COMPREHENSIVE METABOLIC PANEL - Abnormal; Notable for the following components:   Glucose, Bld 224 (*)    BUN 24 (*)    Calcium 8.6 (*)    Total Protein 6.2 (*)    All other components within normal limits  D-DIMER, QUANTITATIVE - Abnormal; Notable for the following components:   D-Dimer, Quant 0.60 (*)    All other components within normal limits  CULTURE, BLOOD (ROUTINE X 2)  CULTURE, BLOOD (ROUTINE X 2)  LACTIC ACID, PLASMA  PROCALCITONIN  LACTATE DEHYDROGENASE  FERRITIN  TRIGLYCERIDES  FIBRINOGEN  C-REACTIVE PROTEIN  LACTIC ACID, PLASMA    EKG EKG Interpretation  Date/Time:  Thursday Sep 02 2020 18:34:39 EDT Ventricular Rate:  144 PR Interval:  176 QRS Duration: 122 QT Interval:  284 QTC Calculation: 398 R Axis:   5 Text Interpretation: ?atrial flutter, rapid rate Nonspecific intraventricular conduction delay tachycardia new since last tracing in 2015 Confirmed by Dorie Rank [phone]) on 09/02/2020 6:47:24 PM   Radiology DG Chest Port 1 View  Result Date: 09/02/2020 CLINICAL DATA:  Dyspnea.  Positive home COVID test. EXAM: PORTABLE CHEST 1 VIEW COMPARISON:  12/23/2013 FINDINGS:  Interval patchy opacity in both lower lung zones. No pleural fluid. Enlarged cardiac silhouette with a mild increase in size. Mild increase in prominence of the pulmonary vasculature. Unremarkable bones. IMPRESSION: 1. Mildly progressive cardiomegaly and pulmonary vascular congestion. 2. Bibasilar pneumonia.  Alveolar edema is less likely. Electronically Signed   By: Claudie Revering M.D.   On: 09/02/2020 19:18  Procedures Procedures   Medications Ordered in ED Medications  sodium chloride flush (NS) 0.9 % injection 3 mL (has no administration in time range)  sodium chloride flush (NS) 0.9 % injection 3 mL (has no administration in time range)  0.9 %  sodium chloride infusion (has no administration in time range)  remdesivir 200 mg in sodium chloride 0.9% 250 mL IVPB (has no administration in time range)    Followed by  remdesivir 100 mg in sodium chloride 0.9 % 100 mL IVPB (has no administration in time range)  diltiazem (CARDIZEM) injection 10 mg (10 mg Intravenous Given 09/02/20 1930)  dexamethasone (DECADRON) injection 6 mg (6 mg Intravenous Given 09/02/20 1930)    ED Course  I have reviewed the triage vital signs and the nursing notes.  Pertinent labs & imaging results that were available during my care of the patient were reviewed by me and considered in my medical decision making (see chart for details).  Clinical Course as of 09/02/20 2208  Thu Sep 02, 2020  2009 Initial D-dimer slightly elevated but within normal age-adjusted range.  CBC shows slight increase in white blood cell count [JK]  2009 Chest x-ray does show bibasilar pneumonia and cardiomegaly.  Presumably related to her home positive COVID test but will wait for confirmatory testing here [JK]    Clinical Course User Index [JK] Dorie Rank, MD   MDM Rules/Calculators/A&P                          Patient presented to ED for evaluation of shortness of breath.  Patient's had coughing and congestion.  She did at home covid  test.  Patient does have a new oxygen requirement.  She is COVID-positive.  Patient has not been vaccinated.  She is high risk for worsening symptoms.  I have ordered steroids and remdesivir.  Patient was also noted to be tachycardic initially..  This may be atrial flutter.  She has responded to a dose of Cardizem.  I will consult the medical service for admission Final Clinical Impression(s) / ED Diagnoses Final diagnoses:  Pneumonia due to COVID-19 virus      Dorie Rank, MD 09/02/20 2209

## 2020-09-02 NOTE — ED Triage Notes (Signed)
Patient to ED via EMS for complaints of SOB x1 day. States she took home COVID test and was positive. Per EMS patient was 77% on RA, improved to 99% on 2L [place]. Patient also in A. Fib on monitor, no hx of such. Patient alert and oriented. Labored breathing noted at triage.

## 2020-09-03 ENCOUNTER — Inpatient Hospital Stay (HOSPITAL_COMMUNITY): Payer: Medicare HMO

## 2020-09-03 DIAGNOSIS — A419 Sepsis, unspecified organism: Secondary | ICD-10-CM

## 2020-09-03 DIAGNOSIS — I4891 Unspecified atrial fibrillation: Secondary | ICD-10-CM | POA: Diagnosis not present

## 2020-09-03 DIAGNOSIS — J1282 Pneumonia due to coronavirus disease 2019: Secondary | ICD-10-CM

## 2020-09-03 DIAGNOSIS — E1169 Type 2 diabetes mellitus with other specified complication: Secondary | ICD-10-CM

## 2020-09-03 DIAGNOSIS — I499 Cardiac arrhythmia, unspecified: Secondary | ICD-10-CM

## 2020-09-03 DIAGNOSIS — J9601 Acute respiratory failure with hypoxia: Secondary | ICD-10-CM

## 2020-09-03 DIAGNOSIS — I10 Essential (primary) hypertension: Secondary | ICD-10-CM

## 2020-09-03 DIAGNOSIS — R6520 Severe sepsis without septic shock: Secondary | ICD-10-CM

## 2020-09-03 LAB — GLUCOSE, CAPILLARY
Glucose-Capillary: 231 mg/dL — ABNORMAL HIGH (ref 70–99)
Glucose-Capillary: 162 mg/dL — ABNORMAL HIGH (ref 70–99)
Glucose-Capillary: 207 mg/dL — ABNORMAL HIGH (ref 70–99)

## 2020-09-03 LAB — CBG MONITORING, ED
Glucose-Capillary: 292 mg/dL — ABNORMAL HIGH (ref 70–99)
Glucose-Capillary: 304 mg/dL — ABNORMAL HIGH (ref 70–99)

## 2020-09-03 LAB — ECHOCARDIOGRAM COMPLETE
Area-P 1/2: 3.08 cm2
Calc EF: 79 %
Height: 63 in
S' Lateral: 2.6 cm
Single Plane A2C EF: 80.1 %
Single Plane A4C EF: 78.2 %
Weight: 3360 oz

## 2020-09-03 LAB — HEMOGLOBIN A1C
Hgb A1c MFr Bld: 7 % — ABNORMAL HIGH (ref 4.8–5.6)
Mean Plasma Glucose: 154.2 mg/dL

## 2020-09-03 LAB — TSH: TSH: 0.909 u[IU]/mL (ref 0.350–4.500)

## 2020-09-03 LAB — BRAIN NATRIURETIC PEPTIDE: B Natriuretic Peptide: 187.4 pg/mL — ABNORMAL HIGH (ref 0.0–100.0)

## 2020-09-03 MED ORDER — DEXAMETHASONE SODIUM PHOSPHATE 10 MG/ML IJ SOLN
6.0000 mg | INTRAMUSCULAR | Status: DC
  Administered 2020-09-03: 6 mg via INTRAVENOUS
  Filled 2020-09-03: qty 1

## 2020-09-03 MED ORDER — VITAMIN D 25 MCG (1000 UNIT) PO TABS
1000.0000 [IU] | ORAL_TABLET | Freq: Every day | ORAL | Status: DC
  Administered 2020-09-03 – 2020-09-06 (×4): 1000 [IU] via ORAL
  Filled 2020-09-03 (×4): qty 1

## 2020-09-03 MED ORDER — CLOPIDOGREL BISULFATE 75 MG PO TABS
75.0000 mg | ORAL_TABLET | Freq: Every day | ORAL | Status: DC
  Administered 2020-09-03 – 2020-09-04 (×2): 75 mg via ORAL
  Filled 2020-09-03 (×2): qty 1

## 2020-09-03 MED ORDER — METOPROLOL TARTRATE 12.5 MG HALF TABLET
12.5000 mg | ORAL_TABLET | Freq: Two times a day (BID) | ORAL | Status: DC
  Administered 2020-09-03 – 2020-09-05 (×4): 12.5 mg via ORAL
  Filled 2020-09-03 (×6): qty 1

## 2020-09-03 MED ORDER — AMLODIPINE BESYLATE 5 MG PO TABS
5.0000 mg | ORAL_TABLET | Freq: Every day | ORAL | Status: DC
  Administered 2020-09-03 – 2020-09-06 (×3): 5 mg via ORAL
  Filled 2020-09-03 (×4): qty 1

## 2020-09-03 MED ORDER — INSULIN ASPART 100 UNIT/ML IJ SOLN
0.0000 [IU] | Freq: Every day | INTRAMUSCULAR | Status: DC
  Administered 2020-09-03: 2 [IU] via SUBCUTANEOUS

## 2020-09-03 MED ORDER — LOSARTAN POTASSIUM 50 MG PO TABS
100.0000 mg | ORAL_TABLET | Freq: Every day | ORAL | Status: DC
  Administered 2020-09-03: 100 mg via ORAL
  Filled 2020-09-03 (×2): qty 2

## 2020-09-03 MED ORDER — PERFLUTREN LIPID MICROSPHERE
1.0000 mL | INTRAVENOUS | Status: AC | PRN
  Administered 2020-09-03: 2 mL via INTRAVENOUS
  Filled 2020-09-03: qty 10

## 2020-09-03 MED ORDER — ENOXAPARIN SODIUM 40 MG/0.4ML IJ SOSY
40.0000 mg | PREFILLED_SYRINGE | INTRAMUSCULAR | Status: DC
  Administered 2020-09-03: 40 mg via SUBCUTANEOUS
  Filled 2020-09-03: qty 0.4

## 2020-09-03 MED ORDER — ZINC SULFATE 220 (50 ZN) MG PO CAPS
220.0000 mg | ORAL_CAPSULE | Freq: Every day | ORAL | Status: DC
  Administered 2020-09-03 – 2020-09-06 (×4): 220 mg via ORAL
  Filled 2020-09-03 (×4): qty 1

## 2020-09-03 MED ORDER — FUROSEMIDE 10 MG/ML IJ SOLN
40.0000 mg | Freq: Once | INTRAMUSCULAR | Status: AC
  Administered 2020-09-03: 40 mg via INTRAVENOUS
  Filled 2020-09-03: qty 4

## 2020-09-03 MED ORDER — GUAIFENESIN-DM 100-10 MG/5ML PO SYRP: 5.0000 mL | ORAL_SOLUTION | ORAL | Status: DC | PRN

## 2020-09-03 MED ORDER — INSULIN ASPART 100 UNIT/ML IJ SOLN
0.0000 [IU] | Freq: Three times a day (TID) | INTRAMUSCULAR | Status: DC
  Administered 2020-09-03: 2 [IU] via SUBCUTANEOUS
  Administered 2020-09-03: 3 [IU] via SUBCUTANEOUS
  Administered 2020-09-03: 5 [IU] via SUBCUTANEOUS
  Administered 2020-09-04: 9 [IU] via SUBCUTANEOUS
  Administered 2020-09-04 (×2): 5 [IU] via SUBCUTANEOUS

## 2020-09-03 MED ORDER — ASCORBIC ACID 500 MG PO TABS
500.0000 mg | ORAL_TABLET | Freq: Every day | ORAL | Status: DC
  Administered 2020-09-03 – 2020-09-06 (×4): 500 mg via ORAL
  Filled 2020-09-03 (×4): qty 1

## 2020-09-03 MED ORDER — APIXABAN 5 MG PO TABS
5.0000 mg | ORAL_TABLET | Freq: Two times a day (BID) | ORAL | Status: DC
  Administered 2020-09-03 – 2020-09-06 (×6): 5 mg via ORAL
  Filled 2020-09-03 (×6): qty 1

## 2020-09-03 MED ORDER — PRAVASTATIN SODIUM 10 MG PO TABS
10.0000 mg | ORAL_TABLET | Freq: Every day | ORAL | Status: DC
  Administered 2020-09-03 – 2020-09-05 (×3): 10 mg via ORAL
  Filled 2020-09-03 (×3): qty 1

## 2020-09-03 NOTE — Evaluation (Signed)
Physical Therapy Evaluation Patient Details Name: Carrie Moses MRN: [id number] DOB: 1941/01/21 Today's Date: 09/03/2020   History of Present Illness  80yo female admitted 09/02/20 with c/o SOB, violent cough, and positive home Covid test. Admitted with sepsis secondary to pneumonia, A-fib. PMH DM, HLD, HTN  Clinical Impression   Patient received in bed, pleasant and cooperative. Session limited by repeated bouts of urinary incontinence making the environment unsafe in which to attempt gait. Able to perform multiple sit to stands, and attempted side stepping however she had severe difficulty with sequencing and instead tried to turn in a circle, prompting PT to have her return to sitting at EOB to maintain safety. Able to laterally scoot up EOB with min guard. SPO2 ranging from 80-94% on room air and had difficulty getting her to follow cues for PLB due to distractibility; HR in Afib pattern mostly from 80-120 during session, at one point did read as 160BPM but quickly recovered to resting rate. Left in bed with VSS, all needs met, bed alarm active. Will benefit from [place] f/u at DC.     Follow Up Recommendations Home health PT;Supervision for mobility/OOB    Equipment Recommendations  Rolling walker with 5" wheels;3in1 (PT)    Recommendations for Other Services       Precautions / Restrictions Precautions Precautions: Fall;Other (comment) Precaution Comments: bring brief, Covid +, watch sats and HR Restrictions Weight Bearing Restrictions: No      Mobility  Bed Mobility Overal bed mobility: Needs Assistance Bed Mobility: Supine to Sit;Sit to Supine     Supine to sit: HOB elevated;Mod assist Sit to supine: HOB elevated;Mod assist   General bed mobility comments: ModA to bring trunk to upright and get to EOB, also ModA to manage BLEs with return to bed    Transfers Overall transfer level: Needs assistance Equipment used: Rolling walker (2 wheeled) Transfers: Sit to/from  Stand Sit to Stand: Min guard         General transfer comment: increased time and effort, VCs for hand placement and sequencing  Ambulation/Gait             General Gait Details: unable- multiple bouts of urinary incontinence making the environment slippery and unsafe to attempt gait  Stairs            Wheelchair Mobility    Modified Rankin (Stroke Patients Only)       Balance Overall balance assessment: Needs assistance;History of Falls Sitting-balance support: No upper extremity supported;Feet supported Sitting balance-Leahy Scale: Fair Sitting balance - Comments: S for safety, mild posterior lean Postural control: Posterior lean Standing balance support: Bilateral upper extremity supported;During functional activity Standing balance-Leahy Scale: Fair Standing balance comment: reliant on external support                             Pertinent Vitals/Pain Pain Assessment: No/denies pain    Home Living Family/patient expects to be discharged to:: Private residence Living Arrangements: Children;Other (Comment) (lives with daughter and her husband; daughter has CA and is on treatments. Son in law works and is in and out of the home.) Available Help at Discharge: Family;Available 24 hours/day Type of Home: House Home Access: Ramped entrance     Home Layout: Two level;Able to live on main level with bedroom/bathroom Home Equipment: Shower seat;Walker - 2 wheels;Cane - single point      Prior Function Level of Independence: Independent  Comments: inconsistent historian at times- first tells me she did not fall, then tells me she did fall and needed help getting up recently     Hand Dominance   Dominant Hand: Right    Extremity/Trunk Assessment   Upper Extremity Assessment Upper Extremity Assessment: Generalized weakness    Lower Extremity Assessment Lower Extremity Assessment: Generalized weakness    Cervical / Trunk  Assessment Cervical / Trunk Assessment: Kyphotic  Communication   Communication: No difficulties  Cognition Arousal/Alertness: Awake/alert Behavior During Therapy: WFL for tasks assessed/performed Overall Cognitive Status: Impaired/Different from baseline Area of Impairment: Attention;Memory;Following commands;Safety/judgement;Awareness;Problem solving                   Current Attention Level: Sustained Memory: Decreased recall of precautions Following Commands: Follows one step commands consistently;Follows one step commands with increased time Safety/Judgement: Decreased awareness of safety;Decreased awareness of deficits Awareness: Intellectual Problem Solving: Slow processing;Decreased initiation;Difficulty sequencing;Requires verbal cues General Comments: able to answer all A&O questions correctly, but gives out of context answers at times- for example, when asked if her home still has a ramp she tells me her street address. Impaired perception of deficits and safety, has a difficult time with problem solving and needs VC for safety/sequencing      General Comments General comments (skin integrity, edema, etc.): SPO2 from 80-94% on room air, HR in Afib pattern from 80s to as much as 160 at highest- manually confirmed HR when she hit 160 and in Afib pattern but at 107BPM    Exercises     Assessment/Plan    PT Assessment Patient needs continued PT services  PT Problem List Decreased strength;Decreased cognition;Obesity;Decreased activity tolerance;Decreased safety awareness;Decreased balance;Decreased mobility;Decreased coordination;Cardiopulmonary status limiting activity       PT Treatment Interventions DME instruction;Balance training;Gait training;Functional mobility training;Patient/family education;Therapeutic activities;Therapeutic exercise    PT Goals (Current goals can be found in the Care Plan section)  Acute Rehab PT Goals Patient Stated Goal: be able to  walk PT Goal Formulation: With patient Time For Goal Achievement: 09/17/20 Potential to Achieve Goals: Good    Frequency Min 3X/week   Barriers to discharge        Co-evaluation               AM-PAC PT "6 Clicks" Mobility  Outcome Measure Help needed turning from your back to your side while in a flat bed without using bedrails?: A Little Help needed moving from lying on your back to sitting on the side of a flat bed without using bedrails?: A Lot Help needed moving to and from a bed to a chair (including a wheelchair)?: A Little Help needed standing up from a chair using your arms (e.g., wheelchair or bedside chair)?: A Little Help needed to walk in hospital room?: A Little Help needed climbing 3-5 steps with a railing? : A Lot 6 Click Score: 16    End of Session   Activity Tolerance: Patient tolerated treatment well Patient left: in bed;with call bell/phone within reach;with bed alarm set Nurse Communication: Mobility status PT Visit Diagnosis: Unsteadiness on feet (R26.81);Muscle weakness (generalized) (M62.81);Difficulty in walking, not elsewhere classified (R26.2);History of falling (Z91.81)    Time: 1448-1524 PT Time Calculation (min) (ACUTE ONLY): 36 min   Charges:   PT Evaluation $PT Eval Moderate Complexity: 1 Mod PT Treatments $Therapeutic Activity: 8-22 mins        Kristen U PT, DPT, PN1   Supplemental Physical Therapist [place]    Pager [phone]   Acute Rehab Office [phone]

## 2020-09-03 NOTE — TOC Initial Note (Signed)
Transition of Care [facility]) - Initial/Assessment Note    Patient Details  Name: Carrie Moses MRN: [id number] Date of Birth: 1940-12-02  Transition of Care [facility]) CM/SW Contact:    Verdell Carmine, RN Phone Number: 09/03/2020, 12:57 PM  Clinical Narrative:                 80 yo in with COVID pneumonia . On oxygen here in hospital. Unvaccinated due to self report of GBS with vaccinations. Also presented with new onset atrial fibrillation . Will obtain eligibility for DOAC. , already on plavix. CM will follow for needs, may need HH post hospitalization.   Expected Discharge Plan: Home/Self Care Barriers to Discharge: Continued Medical Work up   Patient Goals and CMS Choice        Expected Discharge Plan and Services Expected Discharge Plan: Home/Self Care   Discharge Planning Services: CM Consult   Living arrangements for the past 2 months: Single Family Home                                      Prior Living Arrangements/Services Living arrangements for the past 2 months: Single Family Home Lives with:: Self Patient language and need for interpreter reviewed:: Yes        Need for Family Participation in Patient Care: Yes (Comment) Care giver support system in place?: Yes (comment)   Criminal Activity/Legal Involvement Pertinent to Current Situation/Hospitalization: No - Comment as needed  Activities of Daily Living      Permission Sought/Granted                  Emotional Assessment       Orientation: : Oriented to Self,Oriented to Place,Oriented to  Time,Oriented to Situation Alcohol / Substance Use: Not Applicable Psych Involvement: No (comment)  Admission diagnosis:  Pneumonia due to COVID-19 virus [U07.1, J12.82] COVID-19 [U07.1] Patient Active Problem List   Diagnosis Date Noted  . Pneumonia due to COVID-19 virus 09/03/2020  . Sepsis ([place]) 09/03/2020  . Arrhythmia 09/03/2020  . Unspecified cerebral artery occlusion with cerebral  infarction 12/22/2013  . HTN (hypertension) 12/22/2013  . DM (diabetes mellitus) ([place]) 12/22/2013  . Hyperlipidemia 12/22/2013  . CVA (cerebral infarction) 12/22/2013   PCP:  Leonides Sake, MD Pharmacy:   [place], [place] [address] Phone: [phone] Fax: [phone]  [place], [place] [address] Phone: [phone] Fax: [phone]  [id number] - [place], [place] [address] Phone: [phone] Fax: [phone]     Social Determinants of Health (SDOH) Interventions    Readmission Risk Interventions No flowsheet data found.

## 2020-09-03 NOTE — Progress Notes (Signed)
Inpatient Diabetes Program Recommendations  AACE/ADA: New Consensus Statement on Inpatient Glycemic Control (2015)  Target Ranges:  Prepandial:   less than 140 mg/dL      Peak postprandial:   less than 180 mg/dL (1-2 hours)      Critically ill patients:  140 - 180 mg/dL   Lab Results  Component Value Date   GLUCAP 162 (H) 09/03/2020   HGBA1C 7.0 (H) 09/03/2020    Review of Glycemic Control  Diabetes history: DM2 Outpatient Diabetes medications: Levemir 15 units QD, Novolog 3 units TID with meals, Actos 15 mg QDmetaglip 5/500 2 tabs BID (pt is not taking insulin per med rec) Current orders for Inpatient glycemic control: Novolog 0-9 units TID with meals and 0-5 HS  On Decadron 6 mg Q24H  Inpatient Diabetes Program Recommendations:     Will likely need meal coverage insulin with steroids on board. Consider Novolog 3 units TID with meals if eating > 50% meal  If FBS > 180 mg/dL, add Levemir 8 units QD  Will follow glucose trends.  Thank you. Lorenda Peck, RD, LDN, CDE Inpatient Diabetes Coordinator [phone]

## 2020-09-03 NOTE — ED Notes (Signed)
I tried to call report to floor, nurse to call me back

## 2020-09-03 NOTE — H&P (Signed)
History and Physical    Carrie Moses HKV:[id number] DOB: May 17, 1940 DOA: 09/02/2020  PCP: Leonides Sake, MD Patient coming from: Home  Chief Complaint: Cough  HPI: Carrie Moses is a 80 y.o. female with medical history significant of hypertension, hyperlipidemia, insulin-dependent diabetes, stroke presented to the ED via EMS with complaints of shortness of breath.  Patient took a home COVID test which was positive.  Sats 77% on room air with EMS, improved to 99% with 2 L supplemental oxygen.  Patient noted to be tachycardic with irregular rhythm.  In the ED, tachycardic with heart rate in the 130s to 140s with irregular rhythm (A. Fib vs flutter). SARS-CoV-2 PCR test positive.  Labs showing WBC 11.3, hemoglobin 13.5, platelet count 225K.  Sodium 135, potassium 4.1, chloride 101, bicarb 25, BUN 24, creatinine 0.8, glucose 224.  Lactic acid normal x2.  Blood culture x2 pending.  Age-adjusted D-dimer within normal range.  Procalcitonin <0.10.  LDH, ferritin, fibrinogen, triglycerides, and CRP within normal range.  BNP 187.  Chest x-ray showing mildly progressive cardiomegaly, pulmonary vascular congestion, and bibasilar pneumonia.  Patient was given Decadron, remdesivir, and IV Cardizem 10 mg x 2.    Patient states she has been coughing aggressively for the past 1 day to the point that it makes her vomit sometimes.  She denies shortness of breath.  She is not vaccinated against COVID.  States her daughter checked her temperature at home and it was 52 F.  Denies heart palpitations or chest pain.  Denies history of A. fib or arrhythmia.  No other complaints.  Review of Systems:  All systems reviewed and apart from history of presenting illness, are negative.  Past Medical History:  Diagnosis Date  . Diabetes mellitus without complication ([place])   . Hypercholesteremia   . Hypertension     Past Surgical History:  Procedure Laterality Date  . BREAST BIOPSY Right pt unsure   benign      reports that she has never smoked. She does not have any smokeless tobacco history on file. She reports that she does not drink alcohol and does not use drugs.  Allergies  Allergen Reactions  . Other Other (See Comments)    Bell peppers cause nausea and vomiting    Family History  Problem Relation Age of Onset  . Breast cancer Mother     Prior to Admission medications   Medication Sig Start Date End Date Taking? Authorizing Provider  amLODipine (NORVASC) 5 MG tablet Take 5 mg by mouth daily. 07/05/20  Yes [provider]  clopidogrel (PLAVIX) 75 MG tablet Take 1 tablet (75 mg total) by mouth daily. 12/25/13  Yes Reyne Dumas, MD  glipiZIDE-metformin (METAGLIP) 5-500 MG tablet Take 2 tablets by mouth 2 (two) times daily. 06/18/20  Yes [provider]  losartan (COZAAR) 100 MG tablet Take 100 mg by mouth daily.   Yes [provider]  pioglitazone (ACTOS) 15 MG tablet Take 15 mg by mouth daily. 06/18/20  Yes [provider]  pravastatin (PRAVACHOL) 10 MG tablet Take 10 mg by mouth at bedtime. 06/18/20  Yes [provider]  insulin aspart (NOVOLOG) 100 UNIT/ML injection Inject 3 Units into the skin 3 (three) times daily before meals. Patient not taking: No sig reported 12/25/13   Reyne Dumas, MD  insulin detemir (LEVEMIR) 100 UNIT/ML injection Inject 0.15 mLs (15 Units total) into the skin at bedtime. Patient not taking: No sig reported 12/25/13   Reyne Dumas, MD  Physical Exam: Vitals:   09/03/20 0300 09/03/20 0345 09/03/20 0512 09/03/20 0606  BP: 125/62 (!) 152/85 (!) 149/92 (!) 109/56  Pulse: 80 99 (!) 103 97  Resp: 16 (!) 24 20 (!) 24  Temp:      TempSrc:      SpO2: 96% 99% (!) 89% 99%  Weight:      Height:        Physical Exam Constitutional:      General: She is not in acute distress. HENT:     Head: Normocephalic and atraumatic.  Eyes:     Extraocular Movements: Extraocular movements intact.     Conjunctiva/sclera:  Conjunctivae normal.  Cardiovascular:     Rate and Rhythm: Tachycardia present. Rhythm irregular.     Pulses: Normal pulses.     Comments: Slightly tachycardic Pulmonary:     Effort: Pulmonary effort is normal. No respiratory distress.     Breath sounds: Rales present. No wheezing.     Comments: Satting 98% on 4 L supplemental oxygen Abdominal:     General: Bowel sounds are normal. There is no distension.     Palpations: Abdomen is soft.     Tenderness: There is no abdominal tenderness.  Musculoskeletal:        General: No swelling or tenderness.     Cervical back: Normal range of motion and neck supple.  Skin:    General: Skin is warm and dry.  Neurological:     General: No focal deficit present.     Mental Status: She is alert and oriented to person, place, and time.     Labs on Admission: I have personally reviewed following labs and imaging studies  CBC: Recent Labs  Lab 09/02/20 1933  WBC 11.3*  NEUTROABS 10.2*  HGB 13.5  HCT 41.7  MCV 93.5  PLT 268   Basic Metabolic Panel: Recent Labs  Lab 09/02/20 1933  NA 135  K 4.1  CL 101  CO2 25  GLUCOSE 224*  BUN 24*  CREATININE 0.84  CALCIUM 8.6*   GFR: Estimated Creatinine Clearance: 60.6 mL/min (by C-G formula based on SCr of 0.84 mg/dL). Liver Function Tests: Recent Labs  Lab 09/02/20 1933  AST 19  ALT 15  ALKPHOS 46  BILITOT 0.9  PROT 6.2*  ALBUMIN 3.5   No results for input(s): LIPASE, AMYLASE in the last 168 hours. No results for input(s): AMMONIA in the last 168 hours. Coagulation Profile: No results for input(s): INR, PROTIME in the last 168 hours. Cardiac Enzymes: No results for input(s): CKTOTAL, CKMB, CKMBINDEX, TROPONINI in the last 168 hours. BNP (last 3 results) No results for input(s): PROBNP in the last 8760 hours. HbA1C: No results for input(s): HGBA1C in the last 72 hours. CBG: No results for input(s): GLUCAP in the last 168 hours. Lipid Profile: Recent Labs    09/02/20 1934   TRIG 61   Thyroid Function Tests: Recent Labs    09/03/20 0447  TSH 0.909   Anemia Panel: Recent Labs    09/02/20 1933  FERRITIN 36   Urine analysis:    Component Value Date/Time   COLORURINE YELLOW 12/22/2013 2238   APPEARANCEUR CLOUDY (A) 12/22/2013 2238   LABSPEC 1.008 12/22/2013 2238   PHURINE 7.0 12/22/2013 2238   GLUCOSEU 100 (A) 12/22/2013 2238   HGBUR NEGATIVE 12/22/2013 2238   [place] 12/22/2013 2238   [place] 12/22/2013 2238   PROTEINUR NEGATIVE 12/22/2013 2238   UROBILINOGEN 0.2 12/22/2013 2238   NITRITE NEGATIVE 12/22/2013  [place] (A) 12/22/2013 2238    Radiological Exams on Admission: DG Chest Port 1 View  Result Date: 09/02/2020 CLINICAL DATA:  Dyspnea.  Positive home COVID test. EXAM: PORTABLE CHEST 1 VIEW COMPARISON:  12/23/2013 FINDINGS: Interval patchy opacity in both lower lung zones. No pleural fluid. Enlarged cardiac silhouette with a mild increase in size. Mild increase in prominence of the pulmonary vasculature. Unremarkable bones. IMPRESSION: 1. Mildly progressive cardiomegaly and pulmonary vascular congestion. 2. Bibasilar pneumonia.  Alveolar edema is less likely. Electronically Signed   By: Claudie Revering M.D.   On: 09/02/2020 19:18    EKG: Independently reviewed.  A. fib with RVR.  Assessment/Plan Principal Problem:   Pneumonia due to COVID-19 virus Active Problems:   HTN (hypertension)   DM (diabetes mellitus) ([place])   Sepsis ([place])   Arrhythmia   Sepsis secondary to COVID-19 viral pneumonia Meet SIRS criteria with tachycardia and slight tachypnea.  SARS-CoV-2 PCR test positive (patient is unvaccinated).  Chest x-ray showing bibasilar pneumonia.  No lactic acidosis or hypotension to suggest severe sepsis.  No significant elevation of inflammatory markers.  Bacterial pneumonia less likely given procalcitonin <0.10. -Continue remdesivir and Decadron. Vitamin C, zinc, vitamin D. Antitussive as needed.   Blood culture x2 pending.  Airborne and contact precautions.  Incentive spirometry, flutter valve.  Encourage prone positioning.  Daily CBC with differential, CMP, D-dimer, CRP.  Arrhythmia/ ?Afib In the ED, tachycardic with heart rate in the 130s to 140s initially with irregular rhythm (?Afib vs flutter).  She was given IV Cardizem and rate now significantly improved.  Rhythm at present appears to be sinus with PACs.  Arrhythmia likely precipitated by COVID-19 viral infection. -Cardiac monitoring.  Check TSH level.  Echocardiogram ordered and I have messaged cardiology requesting consultation.  ?New onset CHF No documented history of CHF.  Chest x-ray showing cardiomegaly and pulmonary vascular congestion.  BNP 187.  Does not have significant peripheral edema. -Echocardiogram ordered  Acute hypoxemic respiratory failure Likely multifactorial from COVID-19 viral pneumonia and pulmonary vascular congestion. -Continue management as mentioned above.  Continuous pulse ox.  Continue supplemental oxygen, wean as tolerated.  Hypertension Stable. -Continue amlodipine and losartan  History of CVA -Continue Plavix and statin  Insulin-dependent diabetes Per pharmacy med rec, patient is only on oral hypoglycemic agents and not using her home insulin.  Blood glucose 224. -Check A1c.  Sliding scale insulin sensitive ACHS.   DVT prophylaxis: Lovenox Code Status: Patient wishes to be full code. Family Communication: No family available at this time. Disposition Plan: Status is: Inpatient  Remains inpatient appropriate because:Inpatient level of care appropriate due to severity of illness   Dispo: The patient is from: Home              Anticipated d/c is to: Home              Patient currently is not medically stable to d/c.   Difficult to place patient No  Level of care: Level of care: Progressive   The medical decision making on this patient was of high complexity and the patient is at high  risk for clinical deterioration, therefore this is a level 3 visit.  Shela Leff MD [facility]  If 7PM-7AM, please contact night-coverage [email or web address]  09/03/2020, 6:40 AM

## 2020-09-03 NOTE — Consult Note (Addendum)
Cardiology Consultation:   Patient ID: Aronda Burford [place]; [id number]; June 16, 1940   Admit date: 09/02/2020 Date of Consult: 09/03/2020  Primary Care Provider: Leonides Sake, MD Primary Cardiologist: New to [facility]  Patient Profile:   Alverda Nazzaro Delaine is a 80 y.o. female with a hx of HTN, HLD, DM2, and prior CVA who is being seen today for the evaluation of new onset atrial fibrillation at the request of Dr. Marlowe Sax.  History of Present Illness:   Ms. Noy is a 80yo F with a hx as stated above who presented to [facility] 09/03/20 with SOB and worsening cough over the last several days with a fever at 101F. Given her symptoms, she took a home COVID test which was found to be positive. On EMS arrival, O2 saturations found to be 77% on RA with improvement on supplemental O2. On ED arrival she was found to be in atrial fibrillation with RVR with HR's in the 130-140 range. COVID was confirmed positive. BNP mildly elevated at 187 with CXR showing cardiomegaly with pulmonary vascular congestion and bibasilar PNA. She was started on decadron, remdesivir and was given IV diltiazem 10mg  x2. She was admitted to hospital service with cardiology consultation given new onset AF.   On telemetry review, it appears that she has been in and out of AF with intermittent NSR/ST with PAC's. HPI obtained through telephone given positive COVID result. MD to follow. She denies chest pain or other anginal symptoms. She reports she has been feeling poorly several days prior to hospital presentation with progressive SOB and coughing. She denies prior hx of AF and reports no palpitations.   Per chart review, she underwent an echocardiogram 12/2013 which showed an LVEF at 65-70% with no WMA and G1DD performed in the setting of CVA. Dopplers at that time with 1-39% bilateral stenosis.   Past Medical History:  Diagnosis Date  . Diabetes mellitus without complication ([place])   . Hypercholesteremia   . Hypertension      Past Surgical History:  Procedure Laterality Date  . BREAST BIOPSY Right pt unsure   benign     Prior to Admission medications   Medication Sig Start Date End Date Taking? Authorizing Provider  amLODipine (NORVASC) 5 MG tablet Take 5 mg by mouth daily. 07/05/20  Yes [provider]  clopidogrel (PLAVIX) 75 MG tablet Take 1 tablet (75 mg total) by mouth daily. 12/25/13  Yes Reyne Dumas, MD  glipiZIDE-metformin (METAGLIP) 5-500 MG tablet Take 2 tablets by mouth 2 (two) times daily. 06/18/20  Yes [provider]  losartan (COZAAR) 100 MG tablet Take 100 mg by mouth daily.   Yes [provider]  pioglitazone (ACTOS) 15 MG tablet Take 15 mg by mouth daily. 06/18/20  Yes [provider]  pravastatin (PRAVACHOL) 10 MG tablet Take 10 mg by mouth at bedtime. 06/18/20  Yes [provider]  insulin aspart (NOVOLOG) 100 UNIT/ML injection Inject 3 Units into the skin 3 (three) times daily before meals. Patient not taking: No sig reported 12/25/13   Reyne Dumas, MD  insulin detemir (LEVEMIR) 100 UNIT/ML injection Inject 0.15 mLs (15 Units total) into the skin at bedtime. Patient not taking: No sig reported 12/25/13   Reyne Dumas, MD    Inpatient Medications: Scheduled Meds: . amLODipine  5 mg Oral Daily  . vitamin C  500 mg Oral Daily  . cholecalciferol  1,000 Units Oral Daily  . clopidogrel  75 mg Oral Daily  . dexamethasone (DECADRON) injection  6 mg Intravenous Q24H  . enoxaparin (LOVENOX) injection  40 mg Subcutaneous Q24H  . insulin aspart  0-5 Units Subcutaneous QHS  . insulin aspart  0-9 Units Subcutaneous TID WC  . losartan  100 mg Oral Daily  . pravastatin  10 mg Oral QHS  . sodium chloride flush  3 mL Intravenous Q12H  . zinc sulfate  220 mg Oral Daily   Continuous Infusions: . sodium chloride    . remdesivir 100 mg in NS 100 mL 100 mg (09/03/20 0939)   PRN Meds: sodium chloride, guaiFENesin-dextromethorphan  Allergies:     Allergies  Allergen Reactions  . Other Other (See Comments)    Bell peppers cause nausea and vomiting    Social History:   Social History   Socioeconomic History  . Marital status: Married    Spouse name: Not on file  . Number of children: Not on file  . Years of education: Not on file  . Highest education level: Not on file  Occupational History  . Not on file  Tobacco Use  . Smoking status: Never Smoker  . Smokeless tobacco: Not on file  Substance and Sexual Activity  . Alcohol use: No  . Drug use: No  . Sexual activity: Not on file  Other Topics Concern  . Not on file  Social History Narrative  . Not on file   Social Determinants of Health   Financial Resource Strain: Not on file  Food Insecurity: Not on file  Transportation Needs: Not on file  Physical Activity: Not on file  Stress: Not on file  Social Connections: Not on file  Intimate Partner Violence: Not on file    Family History:   Family History  Problem Relation Age of Onset  . Breast cancer Mother    Family Status:  Family Status  Relation Name Status  . Mother  (Not Specified)    ROS:  Please see the history of present illness.  All other ROS reviewed and negative.     Physical Exam/Data:   Vitals:   09/03/20 0300 09/03/20 0345 09/03/20 0512 09/03/20 0606  BP: 125/62 (!) 152/85 (!) 149/92 (!) 109/56  Pulse: 80 99 (!) 103 97  Resp: 16 (!) 24 20 (!) 24  Temp:      TempSrc:      SpO2: 96% 99% (!) 89% 99%  Weight:      Height:        Intake/Output Summary (Last 24 hours) at 09/03/2020 0940 Last data filed at 09/02/2020 2322 Gross per 24 hour  Intake 293.93 ml  Output --  Net 293.93 ml   Filed Weights   09/02/20 1836  Weight: 95.3 kg   Body mass index is 37.2 kg/m.   EKG:  The EKG was personally reviewed and demonstrates:  09/03/20 ST with HR 101bpm and multiple PACs. Prior EKG from 09/02/20 Atrial fibrillation with HR 144bpm Telemetry:  Telemetry was personally reviewed and  demonstrates: 09/03/20 Appears to be intermittently in and out of atrial fibrillation with more recent rhythm being NSR/ST with very frequent PACs   Relevant CV Studies:  Echocardiogram 12/24/2013:  Study Conclusions   - Left ventricle: The cavity size was normal. Wall thickness was    increased in a pattern of mild LVH. Systolic function was    vigorous. The estimated ejection fraction was in the range of 65%    to 70%. Wall motion was normal; there were no regional wall    motion abnormalities. Doppler parameters  are consistent with    abnormal left ventricular relaxation (grade 1 diastolic    dysfunction).  - Right atrium: The atrium was mildly dilated.    Carotid doppler US 12/24/2013:  Findings suggest 1-39% internal carotid artery stenosis  bilaterally. Vertebral arteries are patent with antegrade flow.    Laboratory Data:  Chemistry Recent Labs  Lab 09/02/20 1933  NA 135  K 4.1  CL 101  CO2 25  GLUCOSE 224*  BUN 24*  CREATININE 0.84  CALCIUM 8.6*  GFRNONAA >60  ANIONGAP 9    Total Protein  Date Value Ref Range Status  09/02/2020 6.2 (L) 6.5 - 8.1 g/dL Final   Albumin  Date Value Ref Range Status  09/02/2020 3.5 3.5 - 5.0 g/dL Final   AST  Date Value Ref Range Status  09/02/2020 19 15 - 41 U/L Final   ALT  Date Value Ref Range Status  09/02/2020 15 0 - 44 U/L Final   Alkaline Phosphatase  Date Value Ref Range Status  09/02/2020 46 38 - 126 U/L Final   Total Bilirubin  Date Value Ref Range Status  09/02/2020 0.9 0.3 - 1.2 mg/dL Final   Hematology Recent Labs  Lab 09/02/20 1933  WBC 11.3*  RBC 4.46  HGB 13.5  HCT 41.7  MCV 93.5  MCH 30.3  MCHC 32.4  RDW 12.1  PLT 225   Cardiac EnzymesNo results for input(s): TROPONINI in the last 168 hours. No results for input(s): TROPIPOC in the last 168 hours.  BNP Recent Labs  Lab 09/02/20 2236  BNP 187.4*    DDimer  Recent Labs  Lab 09/02/20 1933  DDIMER 0.60*   TSH:  Lab Results   Component Value Date   TSH 0.909 09/03/2020   Lipids: Lab Results  Component Value Date   CHOL 135 12/23/2013   HDL 25 (L) 12/23/2013   LDLCALC 66 12/23/2013   TRIG 61 09/02/2020   CHOLHDL 5.4 12/23/2013   HgbA1c: Lab Results  Component Value Date   HGBA1C 7.0 (H) 09/03/2020    Radiology/Studies:  [facility] Chest Port 1 View  Result Date: 09/02/2020 CLINICAL DATA:  Dyspnea.  Positive home COVID test. EXAM: PORTABLE CHEST 1 VIEW COMPARISON:  12/23/2013 FINDINGS: Interval patchy opacity in both lower lung zones. No pleural fluid. Enlarged cardiac silhouette with a mild increase in size. Mild increase in prominence of the pulmonary vasculature. Unremarkable bones. IMPRESSION: 1. Mildly progressive cardiomegaly and pulmonary vascular congestion. 2. Bibasilar pneumonia.  Alveolar edema is less likely. Electronically Signed   By: Claudie Revering M.D.   On: 09/02/2020 19:18   Assessment and Plan:   1. New onset paroxsymal atrial fibrillation: -Pt presented with worsening SOB with positive COVID home test found to have a confirmed positive COVID test on admission. Unfortunately EKG on presentation showed atrial fibrillation with RVR at a rate of 144bpm. On telemetry review, it appears that she has been intermittently going in and out of AF with NSR/ST and frequent PACs. AF likely in the setting of acute illness. CHA2DS2VASc score of 62 (age, female, CHF, HTN, CVA, DM). Given this, will plan for [facility] with Eliquis per pharmacy. Obtain echocardiogram. Start low dose beta blocker and up titrate as tolerated.   2. Sepsis secondary to COVID PNA: -Management per IM -Pt presented with worsening SOB with positive COVID home test found to have a confirmed positive COVID test on admission  -Currently being treated with remdesivir and decadron.  -Blood culture x2 pending   3. New  onset presumed diastolic CHF: -BNP noted to be 187 on hospital presentation with CXR showing cardiomegaly and pulmonary vascular  congestion  -Echocardiogram ordered however not yet performed  -May need short course of Lasix  -Creatinine stable at 0.84  4. Acute hypoxic respiratory failure: -Stabilized since ED admission with supplemental O2 -Treatment per IM  5. HTN: -Stable, 132/52>>149/92>>152/85 -On PTA amlodipine, losartan  -Start low dose beta blocker for AF  6. Hx of CVA: -No neuro symptoms -Continue statin and Plavix   7. DM: -SSI for glucose control while inpatient -On PTA insulin -HbA1c, 7.0 this admission     For questions or updates, please contact [place] Please consult [email or web address] for contact info under Cardiology/STEMI.   Lyndel Safe NP-C [facility] Pager: [phone] 09/03/2020 9:40 AM  Personally seen and examined. Agree with APP above with the following comments: Briefly 80 yo F with HTN, HLD, DM, prior stroke ~ 2015, hx of [place] and prior intubation who presents for SOB and COVID-19 Patient notes that she may have had palpitations for the past 2-3 months that are intermittent.  With increase ectopy here, has felt no symptoms Exam notable for irregular heart rhythm, decreased breath sounds bilaterally Labs notable for COVID-19 Positive;  Elevation in BNP  Personally reviewed relevant tests; EKG consistent with AF; has had both shorts runs of regular SVT and AF on telemetry interspersed with SR Would recommend  - starting eliquis and stopping plavix - losartan 100 mg PO Daily reasonable - started lopressor 12.5 mg PO BID with ability to uptitrate based on response - continue statin; LDL goal < 70 - pending echocardiogram (performed but not uploaded into system.  Patient notes that the does not think she will get to this stay, but having experience intubation around [facility] Diagnosis; does not want that again.  Asks for Code Status:  DNR/DNI.  Will reach out to primary MD and if in agreement will change this status.  Discussed with primary team:   Only limitations to therapy and DNR/DNI.  Rudean Haskell, MD [address] [place], [place] [address] [phone]  2:54 PM

## 2020-09-03 NOTE — ED Notes (Signed)
Attempted weaning off O2, did not tolerate, Sat 90 percent, placed back on 1/2L/[place], Dr Sloan Leiter notified

## 2020-09-03 NOTE — Progress Notes (Signed)
ANTICOAGULATION CONSULT NOTE - Initial Consult  Pharmacy Consult for Apixaban Indication: atrial fibrillation - new start  Allergies  Allergen Reactions  . Other Other (See Comments)    Bell peppers cause nausea and vomiting    Patient Measurements: Height: 5\' 3"  (160 cm) Weight: 95.3 kg (210 lb) IBW/kg (Calculated) : 52.4  Vital Signs: Temp: 98.1 F (36.7 C) (05/13 1044) Temp Source: Oral (05/13 1044) BP: 148/73 (05/13 1044) Pulse Rate: 97 (05/13 1044)  Labs: Recent Labs    09/02/20 1933  HGB 13.5  HCT 41.7  PLT 225  CREATININE 0.84   Estimated Creatinine Clearance: 60.6 mL/min (by C-G formula based on SCr of 0.84 mg/dL).  Medical History: Past Medical History:  Diagnosis Date  . Diabetes mellitus without complication ([place])   . Hypercholesteremia   . Hypertension    Medications:  Scheduled:  . amLODipine  5 mg Oral Daily  . vitamin C  500 mg Oral Daily  . cholecalciferol  1,000 Units Oral Daily  . clopidogrel  75 mg Oral Daily  . dexamethasone (DECADRON) injection  6 mg Intravenous Q24H  . enoxaparin (LOVENOX) injection  40 mg Subcutaneous Q24H  . furosemide  40 mg Intravenous Once  . insulin aspart  0-5 Units Subcutaneous QHS  . insulin aspart  0-9 Units Subcutaneous TID WC  . losartan  100 mg Oral Daily  . metoprolol tartrate  12.5 mg Oral BID  . pravastatin  10 mg Oral QHS  . sodium chloride flush  3 mL Intravenous Q12H  . zinc sulfate  220 mg Oral Daily   Assessment: 69 yof admitted for sepsis secondary to COVID PNA with new onset atrial fibrillation. Pharmacy consulted for apixaban new start for atrial fibrillation. Cardiology recommending continuing clopidogrel. H/H and coags are stable at this time. Patient does not meet criteria for renal dosing adjustment at this time.  Goal of Therapy:  Therapeutic anticoagulation   Plan:  Discontinue prophylactic lovenox at this time Start apixaban 5mg  BID at 2200 on 09/03/20  Burnett Corrente 09/03/2020,11:31 AM

## 2020-09-03 NOTE — ED Notes (Signed)
Lab to add on TSH  

## 2020-09-03 NOTE — Progress Notes (Signed)
  Echocardiogram 2D Echocardiogram with definity has been performed.  Darlina Sicilian M 09/03/2020, 2:36 PM

## 2020-09-03 NOTE — TOC Benefit Eligibility Note (Signed)
Transition of Care [facility]) Benefit Eligibility Note    Patient Details  Name: Carrie Moses MRN: [id number] Date of Birth: 07-29-1940   Medication/Dose: Arne Cleveland  5 MG BID  Covered?: Yes  Tier: 3 Drug  Prescription Coverage [facility]erred [facility]: [facility]  and  [facility]  Spoke with Person/Company/Phone Number:: MARIA  @ [employer] #  [phone]  Co-Pay: $ 45.00  Prior Approval: No  Deductible: Met  Additional Notes: XARELTO 15 MG BID : COVER-YES CO-PAY-$45.00 TIER- 3 DRUG  P/A-NO , XARELTO 20 MG DAILY  COVER- YES CO-PAY-$45.00 TIER-3 DRUG P/A-NO and    XARELTO 10 MG BID -NOT COVER / NON-FORMULARY  P/A -YES #  [id number]    Memory Argue Phone Number: 09/03/2020, 2:11 PM

## 2020-09-03 NOTE — Progress Notes (Addendum)
PROGRESS NOTE                                                                                                                                                                                                             Patient Demographics:    Carrie Moses, is a 80 y.o. female, DOB - 03/30/1941, AVW:[id number]  Outpatient Primary MD for the patient is Hamrick, Lorin Mercy, MD   Admit date - 09/02/2020   LOS - 1  Chief Complaint  Patient presents with  . Shortness of Breath       Brief Narrative: Patient is a 80 y.o. female with PMHx of HTN, HLD, DM-2, CVA-presented with several days history of cough, fever, vomiting-upon further evaluation she was found to have acute hypoxic respiratory failure due to COVID-19 pneumonia and A. fib with RVR.   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 fib with RVR.  Significant studies: 5/12>>Chest x-ray: Bibasilar pneumonia.  COVID-19 medications: Steroids: 5/12>> Remdesivir: 5/12>>  Antibiotics: None  Microbiology data: 5/12>>Blood culture: Pending  Procedures: None  Consults: None  DVT prophylaxis: enoxaparin (LOVENOX) injection 40 mg Start: 09/03/20 1000     Subjective:    Carrie Moses today feels better-titrated down to 0.5 L of oxygen.   Assessment  & Plan :   Acute Hypoxic Resp Failure due to Covid 19 Viral pneumonia: Improved-has mild hypoxemia-down to 0.5 L of oxygen this morning.  Inflammatory markers not impressive.  Continue steroid/Remdesivir-mobilize and see how she does.  No signs of volume overload-BNP is marginally elevated-we will give 1 dose of Lasix to ensure negative balance.  Fever: afebrile O2 requirements:  SpO2: 97 % O2 Flow Rate (L/min): 0.5 L/min   COVID-19 Labs: Recent Labs     09/02/20 1933  DDIMER 0.60*  FERRITIN 36  LDH 133  CRP 0.6       Component Value Date/Time   BNP 187.4 (H) 09/02/2020 2236    Recent Labs  Lab 09/02/20 1933  PROCALCITON <0.10    Lab Results  Component Value Date   SARSCOV2NAA POSITIVE (A) 09/02/2020     Prone/Incentive Spirometry: encouraged  incentive spirometry use 3-4/hour.  Sepsis: Due to COVID-19 pneumonia-sepsis physiology has resolved.  PAF with RVR: Back  in sinus rhythm-likely provoked by COVID-19 pneumonia-await echo-CHA2DS2-VASc of around 5-likely will need anticoagulation.  Will await cardiology opinion.  History of CVA: No obvious functional deficits-see above regarding anticoagulation.  HTN: BP stable-continue losartan, amlodipine  HLD: Continue statin  DM-2 (A1c 7.0 on 5/13) controlled hyperglycemia due to steroids: Continue SSI-add 15 units of Lantus while on steroids.  Follow and adjust accordingly.     Recent Labs    09/03/20 0843 09/03/20 0845  GLUCAP 304* 292*   History of Guillain-Barr syndrome/generalized weakness: At times walks with the help of a walker-obtain PT/OT eval.   Obesity: Estimated body mass index is 37.2 kg/m as calculated from the following:   Height as of this encounter: 5\' 3"  (1.6 m).   Weight as of this encounter: 95.3 kg.     ABG: No results found for: PHART, PCO2ART, PO2ART, HCO3, TCO2, ACIDBASEDEF, O2SAT  Vent Settings: N/A  Condition - Stable  Family Communication  :  Son-Terry-[phone] updated over the phone on 5/13  Code Status :  Full Code  Diet :  Diet Order            Diet heart healthy/carb modified Room service appropriate? Yes; Fluid consistency: Thin  Diet effective now                  Disposition Plan  :   Status is: Inpatient  Remains inpatient appropriate because:Inpatient level of care appropriate due to severity of illness   Dispo: The patient is from: Home              Anticipated d/c is to: Home              Patient  currently is not medically stable to d/c.   Difficult to place patient No    Barriers to discharge: Hypoxia requiring O2 supplementation/complete 5 days of IV Remdesivir  Antimicorbials  :    Anti-infectives (From admission, onward)   Start     Dose/Rate Route Frequency Ordered Stop   09/03/20 1000  remdesivir 100 mg in sodium chloride 0.9 % 100 mL IVPB       "Followed by" Linked Group Details   100 mg 200 mL/hr over 30 Minutes Intravenous Daily 09/02/20 2150 09/07/20 0959   09/02/20 2230  remdesivir 200 mg in sodium chloride 0.9% 250 mL IVPB       "Followed by" Linked Group Details   200 mg 580 mL/hr over 30 Minutes Intravenous Once 09/02/20 2150 09/02/20 2322      Inpatient Medications  Scheduled Meds: . amLODipine  5 mg Oral Daily  . vitamin C  500 mg Oral Daily  . cholecalciferol  1,000 Units Oral Daily  . clopidogrel  75 mg Oral Daily  . dexamethasone (DECADRON) injection  6 mg Intravenous Q24H  . enoxaparin (LOVENOX) injection  40 mg Subcutaneous Q24H  . insulin aspart  0-5 Units Subcutaneous QHS  . insulin aspart  0-9 Units Subcutaneous TID WC  . losartan  100 mg Oral Daily  . pravastatin  10 mg Oral QHS  . sodium chloride flush  3 mL Intravenous Q12H  . zinc sulfate  220 mg Oral Daily   Continuous Infusions: . sodium chloride    . remdesivir 100 mg in NS 100 mL 100 mg (09/03/20 0939)   PRN Meds:.sodium chloride, guaiFENesin-dextromethorphan   Time Spent in minutes 35  See all Orders from today for further details   Carrie Moses M.D on 09/03/2020 at 10:11 AM  To page  go to [email or web address] - use universal password  [facility] -  Office  [phone]    Objective:   Vitals:   09/03/20 0345 09/03/20 0512 09/03/20 0606 09/03/20 0943  BP: (!) 152/85 (!) 149/92 (!) 109/56 (!) 132/52  Pulse: 99 (!) 103 97 (!) 104  Resp: (!) 24 20 (!) 24 20  Temp:    98.6 F (37 C)  TempSrc:    Oral  SpO2: 99% (!) 89% 99% 97%  Weight:      Height:         Wt Readings from Last 3 Encounters:  09/02/20 95.3 kg  12/23/13 97.5 kg     Intake/Output Summary (Last 24 hours) at 09/03/2020 1011 Last data filed at 09/02/2020 2322 Gross per 24 hour  Intake 293.93 ml  Output --  Net 293.93 ml     Physical Exam Gen Exam:Alert awake-not in any distress HEENT:atraumatic, normocephalic Chest: B/L clear to auscultation anteriorly CVS:S1S2 regular Abdomen:soft non tender, non distended Extremities:no edema Neurology: Non focal Skin: no rash   Data Review:    CBC Recent Labs  Lab 09/02/20 1933  WBC 11.3*  HGB 13.5  HCT 41.7  PLT 225  MCV 93.5  MCH 30.3  MCHC 32.4  RDW 12.1  LYMPHSABS 0.5*  MONOABS 0.5  EOSABS 0.0  BASOSABS 0.0    Chemistries  Recent Labs  Lab 09/02/20 1933  NA 135  K 4.1  CL 101  CO2 25  GLUCOSE 224*  BUN 24*  CREATININE 0.84  CALCIUM 8.6*  AST 19  ALT 15  ALKPHOS 46  BILITOT 0.9   ------------------------------------------------------------------------------------------------------------------ Recent Labs    09/02/20 1934  TRIG 61    Lab Results  Component Value Date   HGBA1C 7.0 (H) 09/03/2020   ------------------------------------------------------------------------------------------------------------------ Recent Labs    09/03/20 0447  TSH 0.909   ------------------------------------------------------------------------------------------------------------------ Recent Labs    09/02/20 1933  FERRITIN 36    Coagulation profile No results for input(s): INR, PROTIME in the last 168 hours.  Recent Labs    09/02/20 1933  DDIMER 0.60*    Cardiac Enzymes No results for input(s): CKMB, TROPONINI, MYOGLOBIN in the last 168 hours.  Invalid input(s): CK ------------------------------------------------------------------------------------------------------------------    Component Value Date/Time   BNP 187.4 (H) 09/02/2020 2236    Micro Results Recent Results (from the  past 240 hour(s))  Resp Panel by RT-PCR (Flu A&B, Covid) Nasopharyngeal Swab     Status: Abnormal   Collection Time: 09/02/20  6:48 PM   Specimen: Nasopharyngeal Swab; Nasopharyngeal(NP) swabs in vial transport medium  Result Value Ref Range Status   SARS Coronavirus 2 by RT PCR POSITIVE (A) NEGATIVE Final    Comment: RESULT CALLED TO, READ BACK BY AND VERIFIED WITH: Katheran Awe RN 2107 09/02/20 A BROWNING (NOTE) SARS-CoV-2 target nucleic acids are DETECTED.  The SARS-CoV-2 RNA is generally detectable in upper respiratory specimens during the acute phase of infection. Positive results are indicative of the presence of the identified virus, but do not rule out bacterial infection or co-infection with other pathogens not detected by the test. Clinical correlation with patient history and other diagnostic information is necessary to determine patient infection status. The expected result is Negative.  Fact Sheet for Patients: EntrepreneurPulse.com.au  Fact Sheet for Healthcare Providers: IncredibleEmployment.be  This test is not yet approved or cleared by the [place] FDA and  has been authorized for detection and/or diagnosis of SARS-CoV-2 by FDA under an Emergency Use Authorization (EUA).  This EUA will  remain in effect (meaning this test can  be used) for the duration of  the COVID-19 declaration under Section 564(b)(1) of the Act, 21 U.S.C. section 360bbb-3(b)(1), unless the authorization is terminated or revoked sooner.     Influenza A by PCR NEGATIVE NEGATIVE Final   Influenza B by PCR NEGATIVE NEGATIVE Final    Comment: (NOTE) The Xpert Xpress SARS-CoV-2/FLU/RSV plus assay is intended as an aid in the diagnosis of influenza from Nasopharyngeal swab specimens and should not be used as a sole basis for treatment. Nasal washings and aspirates are unacceptable for Xpert Xpress SARS-CoV-2/FLU/RSV testing.  Fact Sheet for  Patients: EntrepreneurPulse.com.au  Fact Sheet for Healthcare Providers: IncredibleEmployment.be  This test is not yet approved or cleared by the [place] FDA and has been authorized for detection and/or diagnosis of SARS-CoV-2 by FDA under an Emergency Use Authorization (EUA). This EUA will remain in effect (meaning this test can be used) for the duration of the COVID-19 declaration under Section 564(b)(1) of the Act, 21 U.S.C. section 360bbb-3(b)(1), unless the authorization is terminated or revoked.  Performed at [facility], [place] [address] [place], [place] [address]     Radiology Reports DG Chest [place] 1 View  Result Date: 09/02/2020 CLINICAL DATA:  Dyspnea.  Positive home COVID test. EXAM: PORTABLE CHEST 1 VIEW COMPARISON:  12/23/2013 FINDINGS: Interval patchy opacity in both lower lung zones. No pleural fluid. Enlarged cardiac silhouette with a mild increase in size. Mild increase in prominence of the pulmonary vasculature. Unremarkable bones. IMPRESSION: 1. Mildly progressive cardiomegaly and pulmonary vascular congestion. 2. Bibasilar pneumonia.  Alveolar edema is less likely. Electronically Signed   By: Claudie Revering M.D.   On: 09/02/2020 19:18

## 2020-09-03 NOTE — ED Notes (Addendum)
MD, Dr. Sloan Leiter, at bedside, MD decreased O2/[place] to 1/2L/[place]

## 2020-09-04 DIAGNOSIS — J1282 Pneumonia due to coronavirus disease 2019: Secondary | ICD-10-CM | POA: Diagnosis not present

## 2020-09-04 DIAGNOSIS — U071 COVID-19: Principal | ICD-10-CM

## 2020-09-04 DIAGNOSIS — I480 Paroxysmal atrial fibrillation: Secondary | ICD-10-CM

## 2020-09-04 LAB — CBC WITH DIFFERENTIAL/PLATELET
Abs Immature Granulocytes: 0.07 10*3/uL (ref 0.00–0.07)
Basophils Absolute: 0 10*3/uL (ref 0.0–0.1)
Basophils Relative: 0 %
Eosinophils Absolute: 0 10*3/uL (ref 0.0–0.5)
Eosinophils Relative: 0 %
HCT: 37.3 % (ref 36.0–46.0)
Hemoglobin: 12.4 g/dL (ref 12.0–15.0)
Immature Granulocytes: 1 %
Lymphocytes Relative: 11 %
Lymphs Abs: 1.4 10*3/uL (ref 0.7–4.0)
MCH: 30.3 pg (ref 26.0–34.0)
MCHC: 33.2 g/dL (ref 30.0–36.0)
MCV: 91.2 fL (ref 80.0–100.0)
Monocytes Absolute: 0.5 10*3/uL (ref 0.1–1.0)
Monocytes Relative: 4 %
Neutro Abs: 10.6 10*3/uL — ABNORMAL HIGH (ref 1.7–7.7)
Neutrophils Relative %: 84 %
Platelets: 238 10*3/uL (ref 150–400)
RBC: 4.09 MIL/uL (ref 3.87–5.11)
RDW: 12.1 % (ref 11.5–15.5)
WBC: 12.5 10*3/uL — ABNORMAL HIGH (ref 4.0–10.5)
nRBC: 0 % (ref 0.0–0.2)

## 2020-09-04 LAB — COMPREHENSIVE METABOLIC PANEL
ALT: 13 U/L (ref 0–44)
AST: 15 U/L (ref 15–41)
Albumin: 3.3 g/dL — ABNORMAL LOW (ref 3.5–5.0)
Alkaline Phosphatase: 40 U/L (ref 38–126)
Anion gap: 8 (ref 5–15)
BUN: 26 mg/dL — ABNORMAL HIGH (ref 8–23)
CO2: 29 mmol/L (ref 22–32)
Calcium: 9.1 mg/dL (ref 8.9–10.3)
Chloride: 96 mmol/L — ABNORMAL LOW (ref 98–111)
Creatinine, Ser: 0.75 mg/dL (ref 0.44–1.00)
GFR, Estimated: >60 mL/min (ref >60)
Glucose, Bld: 190 mg/dL — ABNORMAL HIGH (ref 70–99)
Potassium: 4.5 mmol/L (ref 3.5–5.1)
Sodium: 133 mmol/L — ABNORMAL LOW (ref 135–145)
Total Bilirubin: 0.7 mg/dL (ref 0.3–1.2)
Total Protein: 6.2 g/dL — ABNORMAL LOW (ref 6.5–8.1)

## 2020-09-04 LAB — GLUCOSE, CAPILLARY
Glucose-Capillary: 251 mg/dL — ABNORMAL HIGH (ref 70–99)
Glucose-Capillary: 273 mg/dL — ABNORMAL HIGH (ref 70–99)
Glucose-Capillary: 384 mg/dL — ABNORMAL HIGH (ref 70–99)
Glucose-Capillary: 399 mg/dL — ABNORMAL HIGH (ref 70–99)

## 2020-09-04 LAB — MAGNESIUM: Magnesium: 1.6 mg/dL — ABNORMAL LOW (ref 1.7–2.4)

## 2020-09-04 LAB — C-REACTIVE PROTEIN: CRP: 12.9 mg/dL — ABNORMAL HIGH (ref <1.0)

## 2020-09-04 LAB — D-DIMER, QUANTITATIVE: D-Dimer, Quant: 0.59 ug/mL-FEU — ABNORMAL HIGH (ref 0.00–0.50)

## 2020-09-04 MED ORDER — INSULIN ASPART 100 UNIT/ML IJ SOLN
0.0000 [IU] | Freq: Every day | INTRAMUSCULAR | Status: DC
Start: 2020-09-04 — End: 2020-09-04

## 2020-09-04 MED ORDER — INSULIN ASPART 100 UNIT/ML IJ SOLN
4.0000 [IU] | Freq: Three times a day (TID) | INTRAMUSCULAR | Status: DC
  Administered 2020-09-04: 4 [IU] via SUBCUTANEOUS

## 2020-09-04 MED ORDER — INSULIN ASPART 100 UNIT/ML IJ SOLN: 0.0000 [IU] | Freq: Three times a day (TID) | INTRAMUSCULAR | Status: DC

## 2020-09-04 MED ORDER — INSULIN ASPART 100 UNIT/ML IJ SOLN
0.0000 [IU] | Freq: Three times a day (TID) | INTRAMUSCULAR | Status: DC
  Administered 2020-09-04: 20 [IU] via SUBCUTANEOUS
  Administered 2020-09-05: 11 [IU] via SUBCUTANEOUS
  Administered 2020-09-05: 25 [IU] via SUBCUTANEOUS
  Administered 2020-09-05: 15 [IU] via SUBCUTANEOUS

## 2020-09-04 MED ORDER — MAGNESIUM OXIDE -MG SUPPLEMENT 400 (240 MG) MG PO TABS
800.0000 mg | ORAL_TABLET | Freq: Once | ORAL | Status: AC
  Administered 2020-09-04: 800 mg via ORAL
  Filled 2020-09-04: qty 2

## 2020-09-04 MED ORDER — LOSARTAN POTASSIUM 50 MG PO TABS
50.0000 mg | ORAL_TABLET | Freq: Every day | ORAL | Status: DC
  Administered 2020-09-05 – 2020-09-06 (×2): 50 mg via ORAL
  Filled 2020-09-04 (×2): qty 1

## 2020-09-04 MED ORDER — METHYLPREDNISOLONE SODIUM SUCC 40 MG IJ SOLR
40.0000 mg | Freq: Two times a day (BID) | INTRAMUSCULAR | Status: DC
  Administered 2020-09-04 – 2020-09-05 (×2): 40 mg via INTRAVENOUS
  Filled 2020-09-04 (×2): qty 1

## 2020-09-04 MED ORDER — INSULIN GLARGINE 100 UNIT/ML ~~LOC~~ SOLN
15.0000 [IU] | Freq: Every day | SUBCUTANEOUS | Status: DC
  Administered 2020-09-04: 15 [IU] via SUBCUTANEOUS
  Filled 2020-09-04: qty 0.15

## 2020-09-04 MED ORDER — INSULIN GLARGINE 100 UNIT/ML ~~LOC~~ SOLN
10.0000 [IU] | Freq: Two times a day (BID) | SUBCUTANEOUS | Status: DC
  Administered 2020-09-04: 10 [IU] via SUBCUTANEOUS
  Filled 2020-09-04 (×3): qty 0.1

## 2020-09-04 NOTE — Progress Notes (Signed)
Patient's BP 104/47 with MAP of 59 at this time. Patient asymptomatic. MD Dr. Shirline Frees notified, orders received to hold PM dose of scheduled metoprolol. Will hold medication and continue to monitor.

## 2020-09-04 NOTE — Progress Notes (Signed)
Had long discussion with patient regarding previous experiences with insulin resistance during hospitalization. Messaged physician regarding conversation and discussed the idea of using oral antidiabetics in addition to current insulin regimen. Dr stated he would address in rounds tomorrow.

## 2020-09-04 NOTE — Evaluation (Addendum)
Occupational Therapy Evaluation Patient Details Name: Carrie Moses MRN: [id number] DOB: 07-26-1940 Today's Date: 09/04/2020    History of Present Illness 80yo female admitted 09/02/20 with c/o SOB, violent cough, and positive home Covid test. Admitted with sepsis secondary to pneumonia, A-fib. PMH DM, HLD, HTN   Clinical Impression   Patient admitted for the diagnosis above.  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  She does have a [place], but did not know she could use it for donning pants.  She would benefit from LB ADL with adaptive equipment training, and [place] OT to ensure she has a safe transition home. Currently she is needing up to [facility] for mobility, desaturating with activity, and up to Mod A for lower body ADL.      Follow Up Recommendations  Home health OT    Equipment Recommendations  Other (comment) (hip kit)    Recommendations for Other Services       Precautions / Restrictions Precautions Precautions: Fall Precaution Comments: bring brief, Covid +, watch sats Restrictions Weight Bearing Restrictions: No      Mobility Bed Mobility Overal bed mobility: Needs Assistance Bed Mobility: Supine to Sit     Supine to sit: Min assist;HOB elevated     General bed mobility comments: In the recliner Patient Response: Cooperative  Transfers Overall transfer level: Needs assistance Equipment used: Rolling walker (2 wheeled) Transfers: Sit to/from [facility] Sit to Stand: Min guard Stand pivot transfers: Min guard            Balance Overall balance assessment: Needs assistance Sitting-balance support: Feet supported Sitting balance-Leahy Scale: Good     Standing balance support: Bilateral upper extremity supported;During functional activity Standing balance-Leahy Scale:  Fair Standing balance comment: reliant on walker, but can static stand without AD                           ADL either performed or assessed with clinical judgement   ADL Overall ADL's : Needs assistance/impaired Eating/Feeding: Independent;Sitting   Grooming: Wash/dry hands;Wash/dry face;Set up;Sitting               Lower Body Dressing: Moderate assistance;Sit to/from stand Lower Body Dressing Details (indicate cue type and reason): desats with attempted LB dress seated - 78% on 2L, but rebounds with PLB.             Functional mobility during ADLs: Min guard;Rolling walker       Vision Patient Visual Report: No change from baseline       Perception     Praxis      Pertinent Vitals/Pain Pain Assessment: No/denies pain     Hand Dominance Right   Extremity/Trunk Assessment Upper Extremity Assessment Upper Extremity Assessment: Generalized weakness   Lower Extremity Assessment Lower Extremity Assessment: Generalized weakness   Cervical / Trunk Assessment Cervical / Trunk Assessment: Kyphotic   Communication Communication Communication: No difficulties   Cognition Arousal/Alertness: Awake/alert Behavior During Therapy: WFL for tasks assessed/performed Overall Cognitive Status: Within Functional Limits for tasks assessed                         Following Commands: Follows one step commands consistently;Follows one step commands with increased time     Problem Solving:  Requires verbal cues General Comments: Spoke with patient's son in hallway who reports she is a limited ambulator at baseline. He prefers she returns to daughter's home with [place] up into 140s with marching in place. O2 saturations in 70%s on room air and 90% at rest on room air. Patient returned to [facility] with 2 lpm O2 and sats improved to mid 90%s.    Exercises     Shoulder Instructions      Home Living Family/patient expects to be discharged  to:: Private residence Living Arrangements: Children;Other (Comment) Available Help at Discharge: Family;Available 24 hours/day Type of Home: House Home Access: Ramped entrance     Home Layout: Two level;Able to live on main level with bedroom/bathroom Alternate Level Stairs-Number of Steps: does not access stairs.   Bathroom Shower/Tub: Teacher, early years/pre: Handicapped height     Home Equipment: Clinical cytogeneticist - 2 wheels;Cane - single point          Prior Functioning/Environment Level of Independence: Independent with assistive device(s)        Comments: increased time and effort for LB ADL.  Walks with a RW        OT Problem List: Impaired balance (sitting and/or standing)      OT Treatment/Interventions: Self-care/ADL training;Therapeutic activities;Therapeutic exercise;Energy conservation;Balance training    OT Goals(Current goals can be found in the care plan section) Acute Rehab OT Goals Patient Stated Goal: I'd like to go back home. OT Goal Formulation: With patient Time For Goal Achievement: 09/18/20 Potential to Achieve Goals: Good ADL Goals Pt Will Perform Lower Body Bathing: with modified independence;sit to/from stand;with adaptive equipment Pt Will Perform Lower Body Dressing: with modified independence;sit to/from stand;with adaptive equipment  OT Frequency: Min 2X/week   Barriers to D/C:    none noted       Co-evaluation              AM-PAC OT "6 Clicks" Daily Activity     Outcome Measure Help from another person eating meals?: None Help from another person taking care of personal grooming?: None Help from another person toileting, which includes using toliet, bedpan, or urinal?: A Little Help from another person bathing (including washing, rinsing, drying)?: A Lot Help from another person to put on and taking off regular upper body clothing?: A Little Help from another person to put on and taking off regular lower body  clothing?: A Lot 6 Click Score: 18   End of Session Equipment Utilized During Treatment: Rolling walker  Activity Tolerance: Patient limited by fatigue Patient left: in chair;with call bell/phone within reach  OT Visit Diagnosis: Unsteadiness on feet (R26.81)                Time: 1511-1530 OT Time Calculation (min): 19 min Charges:  OT General Charges $OT Visit: 1 Visit OT Evaluation $OT Eval Moderate Complexity: 1 Mod  09/04/2020  Rich, OTR/L  Acute Rehabilitation Services  Office:  [phone]   Metta Clines 09/04/2020, 3:39 PM

## 2020-09-04 NOTE — Progress Notes (Signed)
PROGRESS NOTE                                                                                                                                                                                                             Patient Demographics:    Carrie Moses, is a 80 y.o. female, DOB - 03-30-1941, [id number]  Outpatient Primary MD for the patient is Hamrick, Lorin Mercy, MD   Admit date - 09/02/2020   LOS - 2  Chief Complaint  Patient presents with  . Shortness of Breath       Brief Narrative: Patient is a 80 y.o. female with PMHx of HTN, HLD, DM-2, CVA-presented with several days history of cough, fever, vomiting-upon further evaluation she was found to have acute hypoxic respiratory failure due to COVID-19 pneumonia and A. fib with RVR.   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 fib with RVR.  Significant studies: 5/12>>Chest x-ray: Bibasilar pneumonia. 5/13>> Echo: EF 0000000, grade 1 diastolic dysfunction.  COVID-19 medications: Steroids: 5/12>> Remdesivir: 5/12>>  Antibiotics: None  Microbiology data: 5/12>>Blood culture: No growth.  Procedures: None  Consults: Cardiology  DVT prophylaxis:  apixaban (ELIQUIS) tablet 5 mg     Subjective:   Feels better-titrated down to room air earlier this morning.   Assessment  & Plan :   Acute Hypoxic Resp Failure due to Covid 19 Viral pneumonia: Improved-on room air this morning-unclear why her CRP has increased significantly when she is clinically improved.  Continue Remdesivir-switch from Decadron to Solu-Medrol.  Mobilize and see how she does.  Continue to follow closely.  Fever: afebrile O2 requirements:  SpO2: (!) 89 % O2 Flow Rate (L/min): 2 L/min   COVID-19 Labs: Recent Labs    09/02/20 1933  09/04/20 0109  DDIMER 0.60* 0.59*  FERRITIN 36  --   LDH 133  --   CRP 0.6 12.9*       Component Value Date/Time   BNP 187.4 (H) 09/02/2020 2236    Recent Labs  Lab 09/02/20 1933  PROCALCITON <0.10    Lab Results  Component Value Date   SARSCOV2NAA POSITIVE (A) 09/02/2020     Prone/Incentive Spirometry: encouraged  incentive spirometry use 3-4/hour.  Sepsis: Due to COVID-19 pneumonia-sepsis  physiology has resolved.  PAF with RVR: Remains in sinus rhythm-evaluated by cardiology-started on beta-blocker and Eliquis.  CHA2DS2-VASc of around 5.   History of CVA: No obvious functional deficits-see above regarding anticoagulation.  No longer on Plavix.  HTN: BP stable-continue losartan and amlodipine.  HLD: Continue statin  DM-2 (A1c 7.0 on 5/13) controlled hyperglycemia due to steroids: CBGs remain elevated-add 15 units of Lantus, 4 units of NovoLog with meals-and continue with SSI.  Follow and adjust.   Recent Labs    09/03/20 1655 09/03/20 2042 09/04/20 0749  GLUCAP 162* 207* 251*   History of Guillain-Barr syndrome/generalized weakness: Worsening weakness due to acute illness-evaluated by PT services-recommendations are for home PT.  At baseline mostly walks with a walker.   Obesity: Estimated body mass index is 37.2 kg/m as calculated from the following:   Height as of this encounter: 5\' 3"  (1.6 m).   Weight as of this encounter: 95.3 kg.     ABG: No results found for: PHART, PCO2ART, PO2ART, HCO3, TCO2, ACIDBASEDEF, O2SAT  Vent Settings: N/A  Condition - Stable  Family Communication  :  Son-[phone] updated over the phone on 5/14  Code Status :  Full Code  Diet :  Diet Order            Diet heart healthy/carb modified Room service appropriate? Yes; Fluid consistency: Thin  Diet effective now                  Disposition Plan  :   Status is: Inpatient  Remains inpatient appropriate because:Inpatient level of care appropriate due  to severity of illness   Dispo: The patient is from: Home              Anticipated d/c is to: Home              Patient currently is not medically stable to d/c.   Difficult to place patient No    Barriers to discharge: Hypoxia requiring O2 supplementation/complete 5 days of IV Remdesivir  Antimicorbials  :    Anti-infectives (From admission, onward)   Start     Dose/Rate Route Frequency Ordered Stop   09/03/20 1000  remdesivir 100 mg in sodium chloride 0.9 % 100 mL IVPB       "Followed by" Linked Group Details   100 mg 200 mL/hr over 30 Minutes Intravenous Daily 09/02/20 2150 09/07/20 0959   09/02/20 2230  remdesivir 200 mg in sodium chloride 0.9% 250 mL IVPB       "Followed by" Linked Group Details   200 mg 580 mL/hr over 30 Minutes Intravenous Once 09/02/20 2150 09/02/20 2322      Inpatient Medications  Scheduled Meds: . amLODipine  5 mg Oral Daily  . apixaban  5 mg Oral BID  . vitamin C  500 mg Oral Daily  . cholecalciferol  1,000 Units Oral Daily  . dexamethasone (DECADRON) injection  6 mg Intravenous Q24H  . insulin aspart  0-5 Units Subcutaneous QHS  . insulin aspart  0-9 Units Subcutaneous TID WC  . [START ON 09/05/2020] losartan  50 mg Oral Daily  . metoprolol tartrate  12.5 mg Oral BID  . pravastatin  10 mg Oral QHS  . sodium chloride flush  3 mL Intravenous Q12H  . zinc sulfate  220 mg Oral Daily   Continuous Infusions: . sodium chloride    . remdesivir 100 mg in NS 100 mL 100 mg (09/04/20 1029)   PRN Meds:.sodium chloride, guaiFENesin-dextromethorphan  Time Spent in minutes 25  See all Orders from today for further details   Oren Binet M.D on 09/04/2020 at 11:35 AM  To page go to [email or web address] - use universal password  [facility] -  Office  [phone]    Objective:   Vitals:   09/03/20 2142 09/04/20 0010 09/04/20 0452 09/04/20 0751  BP: (!) 104/47 (!) 108/58 (!) 95/56 116/72  Pulse: 97 78 93 83  Resp: 20 20 18 20   Temp:   98.2 F (36.8 C) 98.3 F (36.8 C) 98 F (36.7 C)  TempSrc:  Oral Oral Oral  SpO2: 95%  96% (!) 89%  Weight:      Height:        Wt Readings from Last 3 Encounters:  09/02/20 95.3 kg  12/23/13 97.5 kg     Intake/Output Summary (Last 24 hours) at 09/04/2020 1135 Last data filed at 09/04/2020 0454 Gross per 24 hour  Intake 120 ml  Output 150 ml  Net -30 ml     Physical Exam Gen Exam:Alert awake-not in any distress HEENT:atraumatic, normocephalic Chest: B/L clear to auscultation anteriorly CVS:S1S2 regular Abdomen:soft non tender, non distended Extremities:no edema Neurology: Non focal Skin: no rash   Data Review:    CBC Recent Labs  Lab 09/02/20 1933 09/04/20 0109  WBC 11.3* 12.5*  HGB 13.5 12.4  HCT 41.7 37.3  PLT 225 238  MCV 93.5 91.2  MCH 30.3 30.3  MCHC 32.4 33.2  RDW 12.1 12.1  LYMPHSABS 0.5* 1.4  MONOABS 0.5 0.5  EOSABS 0.0 0.0  BASOSABS 0.0 0.0    Chemistries  Recent Labs  Lab 09/02/20 1933 09/04/20 0109  NA 135 133*  K 4.1 4.5  CL 101 96*  CO2 25 29  GLUCOSE 224* 190*  BUN 24* 26*  CREATININE 0.84 0.75  CALCIUM 8.6* 9.1  MG  --  1.6*  AST 19 15  ALT 15 13  ALKPHOS 46 40  BILITOT 0.9 0.7   ------------------------------------------------------------------------------------------------------------------ Recent Labs    09/02/20 1934  TRIG 61    Lab Results  Component Value Date   HGBA1C 7.0 (H) 09/03/2020   ------------------------------------------------------------------------------------------------------------------ Recent Labs    09/03/20 0447  TSH 0.909   ------------------------------------------------------------------------------------------------------------------ Recent Labs    09/02/20 1933  FERRITIN 36    Coagulation profile No results for input(s): INR, PROTIME in the last 168 hours.  Recent Labs    09/02/20 1933 09/04/20 0109  DDIMER 0.60* 0.59*    Cardiac Enzymes No results for input(s):  CKMB, TROPONINI, MYOGLOBIN in the last 168 hours.  Invalid input(s): CK ------------------------------------------------------------------------------------------------------------------    Component Value Date/Time   BNP 187.4 (H) 09/02/2020 2236    Micro Results Recent Results (from the past 240 hour(s))  Resp Panel by RT-PCR (Flu A&B, Covid) Nasopharyngeal Swab     Status: Abnormal   Collection Time: 09/02/20  6:48 PM   Specimen: Nasopharyngeal Swab; Nasopharyngeal(NP) swabs in vial transport medium  Result Value Ref Range Status   SARS Coronavirus 2 by RT PCR POSITIVE (A) NEGATIVE Final    Comment: RESULT CALLED TO, READ BACK BY AND VERIFIED WITH: Katheran Awe RN 2107 09/02/20 A BROWNING (NOTE) SARS-CoV-2 target nucleic acids are DETECTED.  The SARS-CoV-2 RNA is generally detectable in upper respiratory specimens during the acute phase of infection. Positive results are indicative of the presence of the identified virus, but do not rule out bacterial infection or co-infection with other pathogens not detected by the test. Clinical correlation with patient history  and other diagnostic information is necessary to determine patient infection status. The expected result is Negative.  Fact Sheet for Patients: BloggerCourse.comhttps://www.fda.gov/media/152166/download  Fact Sheet for Healthcare Providers: SeriousBroker.ithttps://www.fda.gov/media/152162/download  This test is not yet approved or cleared by the [place] FDA and  has been authorized for detection and/or diagnosis of SARS-CoV-2 by FDA under an Emergency Use Authorization (EUA).  This EUA will remain in effect (meaning this test can  be used) for the duration of  the COVID-19 declaration under Section 564(b)(1) of the Act, 21 U.S.C. section 360bbb-3(b)(1), unless the authorization is terminated or revoked sooner.     Influenza A by PCR NEGATIVE NEGATIVE Final   Influenza B by PCR NEGATIVE NEGATIVE Final    Comment: (NOTE) The Xpert Xpress  SARS-CoV-2/FLU/RSV plus assay is intended as an aid in the diagnosis of influenza from Nasopharyngeal swab specimens and should not be used as a sole basis for treatment. Nasal washings and aspirates are unacceptable for Xpert Xpress SARS-CoV-2/FLU/RSV testing.  Fact Sheet for Patients: BloggerCourse.comhttps://www.fda.gov/media/152166/download  Fact Sheet for Healthcare Providers: SeriousBroker.ithttps://www.fda.gov/media/152162/download  This test is not yet approved or cleared by the [place] FDA and has been authorized for detection and/or diagnosis of SARS-CoV-2 by FDA under an Emergency Use Authorization (EUA). This EUA will remain in effect (meaning this test can be used) for the duration of the COVID-19 declaration under Section 564(b)(1) of the Act, 21 U.S.C. section 360bbb-3(b)(1), unless the authorization is terminated or revoked.  Performed at [facility], [address] [place], [place] [address]   Blood Culture (routine x 2)     Status: None (Preliminary result)   Collection Time: 09/02/20  6:53 PM   Specimen: BLOOD  Result Value Ref Range Status   Specimen Description BLOOD SITE NOT SPECIFIED  Final   Special Requests   Final    BOTTLES DRAWN AEROBIC AND ANAEROBIC Blood Culture results may not be optimal due to an inadequate volume of blood received in culture bottles   Culture   Final    NO GROWTH < 24 HOURS Performed at [facility], [address] [place], [place] [address]    Report Status PENDING  Incomplete  Blood Culture (routine x 2)     Status: None (Preliminary result)   Collection Time: 09/02/20 11:25 PM   Specimen: BLOOD  Result Value Ref Range Status   Specimen Description BLOOD SITE NOT SPECIFIED  Final   Special Requests   Final    BOTTLES DRAWN AEROBIC AND ANAEROBIC Blood Culture results may not be optimal due to an inadequate volume of blood received in culture bottles   Culture   Final    NO GROWTH < 24 HOURS Performed at [facility], [address] [place], [place] [address]    Report Status PENDING  Incomplete    Radiology Reports DG Chest Port 1 View  Result Date: 09/02/2020 CLINICAL DATA:  Dyspnea.  Positive home COVID test. EXAM: PORTABLE CHEST 1 VIEW COMPARISON:  12/23/2013 FINDINGS: Interval patchy opacity in both lower lung zones. No pleural fluid. Enlarged cardiac silhouette with a mild increase in size. Mild increase in prominence of the pulmonary vasculature. Unremarkable bones. IMPRESSION: 1. Mildly progressive cardiomegaly and pulmonary vascular congestion. 2. Bibasilar pneumonia.  Alveolar edema is less likely. Electronically Signed   By: Beckie SaltsSteven  Reid M.D.   On: 09/02/2020 19:18   ECHOCARDIOGRAM COMPLETE  Result Date: 09/03/2020    ECHOCARDIOGRAM REPORT   [facility] Date of Exam: 09/03/2020 Medical Rec #:  [id number]            Height:       63.0 in Accession #:    [id number]           Weight:       210.0 lb Date of Birth:  03-31-1941            BSA:          1.974 m Patient Age:    53 years             BP:           129/71 mmHg Patient Gender: F                    HR:           112 bpm. Exam Location:  Inpatient Procedure: 2D Echo, Cardiac Doppler, Color Doppler and Intracardiac            Opacification Agent Indications:    Atrial Fibrillation I48.91  History:        Patient has prior history of Echocardiogram examinations, most                 recent 12/24/2013. Risk Factors:Hypertension, Diabetes and                 Dyslipidemia. Pneumonia due to COVID-19 virus. Sepisis.  Sonographer:    Darlina Sicilian RDCS Referring Phys: [id number] [place]  1. Left ventricular ejection fraction, by estimation, is 55 to 60%. The left ventricle has normal function. The left ventricle has no regional wall motion abnormalities. There is mild left ventricular hypertrophy. Left ventricular diastolic parameters are consistent with Grade I diastolic dysfunction (impaired relaxation).  2. Right ventricular  systolic function is normal. The right ventricular size is mildly enlarged. There is mildly elevated pulmonary artery systolic pressure. The estimated right ventricular systolic pressure is 90.2 mmHg.  3. Left atrial size was mildly dilated.  4. The mitral valve is normal in structure. Trivial mitral valve regurgitation. No evidence of mitral stenosis.  5. The aortic valve is tricuspid. Aortic valve regurgitation is not visualized. No aortic stenosis is present.  6. The inferior vena cava is normal in size with greater than 50% respiratory variability, suggesting right atrial pressure of 3 mmHg. FINDINGS  Left Ventricle: Left ventricular ejection fraction, by estimation, is 55 to 60%. The left ventricle has normal function. The left ventricle has no regional wall motion abnormalities. Definity contrast agent was given IV to delineate the left ventricular  endocardial borders. The left ventricular internal cavity size was normal in size. There is mild left ventricular hypertrophy. Left ventricular diastolic parameters are consistent with Grade I diastolic dysfunction (impaired relaxation). Right Ventricle: The right ventricular size is mildly enlarged. No increase in right ventricular wall thickness. Right ventricular systolic function is normal. There is mildly elevated pulmonary artery systolic pressure. The tricuspid regurgitant velocity is 2.91 m/s, and with an assumed right atrial pressure of 3 mmHg, the estimated right ventricular systolic pressure is 40.9 mmHg. Left Atrium: Left atrial size was mildly dilated. Right Atrium: Right atrial size was normal in size. Pericardium: There is no evidence of pericardial effusion. Mitral Valve: The mitral valve is normal in structure. Mild mitral annular calcification. Trivial mitral valve regurgitation. No evidence of mitral valve stenosis. Tricuspid Valve: The tricuspid valve is normal in structure. Tricuspid valve regurgitation is mild. Aortic Valve: The aortic  valve  is tricuspid. Aortic valve regurgitation is not visualized. No aortic stenosis is present. Pulmonic Valve: The pulmonic valve was normal in structure. Pulmonic valve regurgitation is not visualized. Aorta: The aortic root is normal in size and structure. Venous: The inferior vena cava is normal in size with greater than 50% respiratory variability, suggesting right atrial pressure of 3 mmHg. IAS/Shunts: No atrial level shunt detected by color flow Doppler.  LEFT VENTRICLE PLAX 2D LVIDd:         4.00 cm     Diastology LVIDs:         2.60 cm     LV e' medial:    4.24 cm/s LV PW:         0.80 cm     LV E/e' medial:  14.3 LV IVS:        1.40 cm     LV e' lateral:   5.66 cm/s LVOT diam:     2.10 cm     LV E/e' lateral: 10.7 LV SV:         66 LV SV Index:   34 LVOT Area:     3.46 cm  LV Volumes (MOD) LV vol d, MOD A2C: 81.3 ml LV vol d, MOD A4C: 91.9 ml LV vol s, MOD A2C: 16.2 ml LV vol s, MOD A4C: 20.0 ml LV SV MOD A2C:     65.1 ml LV SV MOD A4C:     91.9 ml LV SV MOD BP:      68.7 ml RIGHT VENTRICLE RV S prime:     13.20 cm/s LEFT ATRIUM             Index LA diam:        3.20 cm 1.62 cm/m LA Vol (A2C):   34.2 ml 17.32 ml/m LA Vol (A4C):   42.2 ml 21.38 ml/m LA Biplane Vol: 40.0 ml 20.26 ml/m  AORTIC VALVE LVOT Vmax:   102.00 cm/s LVOT Vmean:  75.700 cm/s LVOT VTI:    0.191 m  AORTA Ao Root diam: 3.00 cm Ao Asc diam:  3.10 cm MITRAL VALVE               TRICUSPID VALVE MV Area (PHT): 3.08 cm    TR Peak grad:   33.9 mmHg MV Decel Time: 246 msec    TR Vmax:        291.00 cm/s MV E velocity: 60.60 cm/s MV A velocity: 73.70 cm/s  SHUNTS MV E/A ratio:  0.82        Systemic VTI:  0.19 m                            Systemic Diam: 2.10 cm Loralie Champagne MD Electronically signed by Loralie Champagne MD Signature Date/Time: 09/03/2020/4:05:10 PM    Final

## 2020-09-04 NOTE — Progress Notes (Signed)
Physical Therapy Treatment Patient Details Name: Carrie Moses MRN: [id number] DOB: 1941-02-21 Today's Date: 09/04/2020    History of Present Illness 80yo female admitted 09/02/20 with c/o SOB, violent cough, and positive home Covid test. Admitted with sepsis secondary to pneumonia, A-fib. PMH DM, HLD, HTN    PT Comments    Patient received in bed, son had just left. She is motivated to get moving and oob. She continues to have mild sob just with speaking and minimal bed mobility. She requires min assist to raise trunk to seated position. Once seated, balance in good. Patient able to sit to stand with min guard and RW, ambulated 4 feet to recliner with RW and min guard. Once oxygen saturation recovered, then patient stood again and performed standing marching. O2 sats down to mid 70%s with activity on room air.  Patient will continue to benefit from skilled PT while here to improve strength, safety and activity tolerance.     Follow Up Recommendations  Home health PT;Supervision for mobility/OOB     Equipment Recommendations  None recommended by PT;Other (comment) (patient has a walker)    Recommendations for Other Services       Precautions / Restrictions Precautions Precautions: Fall Restrictions Weight Bearing Restrictions: No    Mobility  Bed Mobility Overal bed mobility: Needs Assistance Bed Mobility: Supine to Sit     Supine to sit: Min assist;HOB elevated     General bed mobility comments: min assist to raise trunk to seated position.    Transfers Overall transfer level: Needs assistance Equipment used: Rolling walker (2 wheeled) Transfers: Sit to/from Stand Sit to Stand: Min guard            Ambulation/Gait Ambulation/Gait assistance: Min guard Gait Distance (Feet): 4 Feet Assistive device: Rolling walker (2 wheeled) Gait Pattern/deviations: Step-through pattern Gait velocity: decr   General Gait Details: Patient able to ambulate to recliner  with min guard. She is limited by sob and HR elevation with mobility. O2 sats into 70%s on room air.   Stairs             Wheelchair Mobility    Modified Rankin (Stroke Patients Only)       Balance Overall balance assessment: Needs assistance Sitting-balance support: Feet supported Sitting balance-Leahy Scale: Good     Standing balance support: Bilateral upper extremity supported;During functional activity Standing balance-Leahy Scale: Fair Standing balance comment: reliant on walker                            Cognition Arousal/Alertness: Awake/alert Behavior During Therapy: WFL for tasks assessed/performed Overall Cognitive Status: Within Functional Limits for tasks assessed                         Following Commands: Follows one step commands consistently;Follows one step commands with increased time     Problem Solving: Requires verbal cues General Comments: Spoke with patient's son in hallway who reports she is a limited ambulator at baseline. He prefers she returns to daughter's home with HHPT      Exercises      General Comments General comments (skin integrity, edema, etc.): HR up into 140s with marching in place. O2 saturations in 70%s on room air and 90% at rest on room air. Patient returned to [facility] with 2 lpm O2 and sats improved to mid 90%s.      Pertinent Vitals/Pain Pain Assessment: No/denies pain  Home Living                      Prior Function            PT Goals (current goals can now be found in the care plan section) Acute Rehab PT Goals Patient Stated Goal: be able to walk PT Goal Formulation: With patient Time For Goal Achievement: 09/17/20 Potential to Achieve Goals: Good Progress towards PT goals: Progressing toward goals    Frequency    Min 3X/week      PT Plan Current plan remains appropriate    Co-evaluation              AM-PAC PT "6 Clicks" Mobility   Outcome Measure  Help  needed turning from your back to your side while in a flat bed without using bedrails?: A Little Help needed moving from lying on your back to sitting on the side of a flat bed without using bedrails?: A Little Help needed moving to and from a bed to a chair (including a wheelchair)?: A Little Help needed standing up from a chair using your arms (e.g., wheelchair or bedside chair)?: A Little Help needed to walk in hospital room?: A Little Help needed climbing 3-5 steps with a railing? : A Lot 6 Click Score: 17    End of Session Equipment Utilized During Treatment: Oxygen Activity Tolerance: Other (comment);Treatment limited secondary to medical complications (Comment) (Limited by O2 sats and HR elevation) Patient left: in chair;with call bell/phone within reach;with nursing/sitter in room Nurse Communication: Mobility status PT Visit Diagnosis: Unsteadiness on feet (R26.81);Muscle weakness (generalized) (M62.81);Difficulty in walking, not elsewhere classified (R26.2);History of falling (Z91.81)     Time: 6761-9509 PT Time Calculation (min) (ACUTE ONLY): 23 min  Charges:  $Gait Training: 8-22 mins $Therapeutic Exercise: 8-22 mins                     [employer], PT, GCS 09/04/20,3:00 PM

## 2020-09-04 NOTE — Progress Notes (Signed)
Patient having 2-3 beat runs of pvc's. Spoke with Dr. Irene Pap, verbal orders received to perform stat 12 lead ekg and add on magnesium for AM labs.

## 2020-09-04 NOTE — Progress Notes (Signed)
   Progress Note  Patient Name: Carrie Moses Date of Encounter: 09/04/2020  Primary Cardiologist: Werner Lean, [place] [facility] course and testing reviewed since cardiology consultation on May 13.  Patient continues on supportive measures for treatment of acute hypoxic respiratory failure secondary to COVID-19 pneumonia - per primary team.  She was started on Eliquis for stroke prophylaxis due to paroxysmal atrial fibrillation with RVR.  Also started on low-dose Lopressor, however dose held due to relatively low blood pressure.  She is concurrently on Cozaar and Norvasc.  Recent systolics running 14E to 315Q.  Heart rate 90s to low 100s.  Echocardiogram reports LVEF 55 to 60% with mild diastolic dysfunction, normal RV contraction and mildly elevated estimated RVSP at 37 mmHg.  Left atrium mildly dilated.  I personally reviewed her ECG from today which shows sinus rhythm with prolonged PR interval, frequent PACs and burst of atrial fibrillation.  Recommend reducing losartan to 50 mg daily to allow room to continue Lopressor on a regular basis and for further titration if necessary.  She was started on Eliquis with plan to stop Plavix per Dr. Gasper Sells (will stop Plavix).  Potassium normal.  Would replete magnesium.  Signed, Rozann Lesches, MD  09/04/2020, 10:52 AM

## 2020-09-05 ENCOUNTER — Encounter (HOSPITAL_COMMUNITY): Payer: Self-pay | Admitting: Internal Medicine

## 2020-09-05 LAB — CBC WITH DIFFERENTIAL/PLATELET
Abs Immature Granulocytes: 0.06 10*3/uL (ref 0.00–0.07)
Basophils Absolute: 0 10*3/uL (ref 0.0–0.1)
Basophils Relative: 0 %
Eosinophils Absolute: 0 10*3/uL (ref 0.0–0.5)
Eosinophils Relative: 0 %
HCT: 39 % (ref 36.0–46.0)
Hemoglobin: 13.3 g/dL (ref 12.0–15.0)
Immature Granulocytes: 1 %
Lymphocytes Relative: 10 %
Lymphs Abs: 1 10*3/uL (ref 0.7–4.0)
MCH: 30.1 pg (ref 26.0–34.0)
MCHC: 34.1 g/dL (ref 30.0–36.0)
MCV: 88.2 fL (ref 80.0–100.0)
Monocytes Absolute: 0.5 10*3/uL (ref 0.1–1.0)
Monocytes Relative: 5 %
Neutro Abs: 8.6 10*3/uL — ABNORMAL HIGH (ref 1.7–7.7)
Neutrophils Relative %: 84 %
Platelets: 274 10*3/uL (ref 150–400)
RBC: 4.42 MIL/uL (ref 3.87–5.11)
RDW: 11.9 % (ref 11.5–15.5)
WBC: 10.1 10*3/uL (ref 4.0–10.5)
nRBC: 0 % (ref 0.0–0.2)

## 2020-09-05 LAB — COMPREHENSIVE METABOLIC PANEL
ALT: 13 U/L (ref 0–44)
AST: 14 U/L — ABNORMAL LOW (ref 15–41)
Albumin: 3.2 g/dL — ABNORMAL LOW (ref 3.5–5.0)
Alkaline Phosphatase: 51 U/L (ref 38–126)
Anion gap: 5 (ref 5–15)
BUN: 30 mg/dL — ABNORMAL HIGH (ref 8–23)
CO2: 31 mmol/L (ref 22–32)
Calcium: 9.2 mg/dL (ref 8.9–10.3)
Chloride: 96 mmol/L — ABNORMAL LOW (ref 98–111)
Creatinine, Ser: 0.79 mg/dL (ref 0.44–1.00)
GFR, Estimated: >60 mL/min (ref >60)
Glucose, Bld: 413 mg/dL — ABNORMAL HIGH (ref 70–99)
Potassium: 4.8 mmol/L (ref 3.5–5.1)
Sodium: 132 mmol/L — ABNORMAL LOW (ref 135–145)
Total Bilirubin: 0.4 mg/dL (ref 0.3–1.2)
Total Protein: 6.3 g/dL — ABNORMAL LOW (ref 6.5–8.1)

## 2020-09-05 LAB — D-DIMER, QUANTITATIVE: D-Dimer, Quant: 0.28 ug/mL-FEU (ref 0.00–0.50)

## 2020-09-05 LAB — GLUCOSE, CAPILLARY
Glucose-Capillary: 406 mg/dL — ABNORMAL HIGH (ref 70–99)
Glucose-Capillary: 344 mg/dL — ABNORMAL HIGH (ref 70–99)
Glucose-Capillary: 292 mg/dL — ABNORMAL HIGH (ref 70–99)
Glucose-Capillary: 160 mg/dL — ABNORMAL HIGH (ref 70–99)
Glucose-Capillary: 65 mg/dL — ABNORMAL LOW (ref 70–99)

## 2020-09-05 LAB — C-REACTIVE PROTEIN: CRP: 6.3 mg/dL — ABNORMAL HIGH (ref <1.0)

## 2020-09-05 MED ORDER — INSULIN GLARGINE 100 UNIT/ML ~~LOC~~ SOLN
16.0000 [IU] | Freq: Two times a day (BID) | SUBCUTANEOUS | Status: DC
  Administered 2020-09-05: 16 [IU] via SUBCUTANEOUS
  Filled 2020-09-05 (×5): qty 0.16

## 2020-09-05 MED ORDER — ALUM & MAG HYDROXIDE-SIMETH 200-200-20 MG/5ML PO SUSP
15.0000 mL | Freq: Four times a day (QID) | ORAL | Status: DC | PRN
  Administered 2020-09-05: 15 mL via ORAL
  Filled 2020-09-05: qty 30

## 2020-09-05 MED ORDER — DEXTROSE 50 % IV SOLN: 12.5000 g | INTRAVENOUS | Status: AC

## 2020-09-05 MED ORDER — PREDNISONE 20 MG PO TABS
40.0000 mg | ORAL_TABLET | Freq: Every day | ORAL | Status: DC
  Administered 2020-09-06: 40 mg via ORAL
  Filled 2020-09-05: qty 2

## 2020-09-05 MED ORDER — ONDANSETRON HCL 4 MG/2ML IJ SOLN
4.0000 mg | Freq: Four times a day (QID) | INTRAMUSCULAR | Status: DC | PRN
  Administered 2020-09-05: 4 mg via INTRAVENOUS
  Filled 2020-09-05: qty 2

## 2020-09-05 MED ORDER — INSULIN ASPART 100 UNIT/ML IJ SOLN
8.0000 [IU] | Freq: Three times a day (TID) | INTRAMUSCULAR | Status: DC
  Administered 2020-09-05 (×2): 8 [IU] via SUBCUTANEOUS

## 2020-09-05 MED ORDER — DEXTROSE 50 % IV SOLN
INTRAVENOUS | Status: AC
  Administered 2020-09-05: 12.5 g via INTRAVENOUS
  Filled 2020-09-05: qty 50

## 2020-09-05 MED ORDER — ONDANSETRON 4 MG PO TBDP
4.0000 mg | ORAL_TABLET | Freq: Once | ORAL | Status: AC
  Administered 2020-09-05: 4 mg via ORAL

## 2020-09-05 NOTE — Plan of Care (Signed)
  Problem: Education: Goal: Knowledge of General Education information will improve Description: Including pain rating scale, medication(s)/side effects and non-pharmacologic comfort measures Outcome: Progressing   Problem: Health Behavior/Discharge Planning: Goal: Ability to manage health-related needs will improve Outcome: Progressing   Problem: Clinical Measurements: Goal: Ability to maintain clinical measurements within normal limits will improve Outcome: Progressing   Problem: Activity: Goal: Risk for activity intolerance will decrease Outcome: Progressing   Problem: Education: Goal: Knowledge of risk factors and measures for prevention of condition will improve Outcome: Progressing   Problem: Respiratory: Goal: Will maintain a patent airway Outcome: Progressing

## 2020-09-05 NOTE — Progress Notes (Signed)
Patient's current CBG 399. Spoke with Dr. Irene Pap, orders received to medicate with Novolog per resistant scale and Lantus. Patient medicated, see mar. Will continue to monitor.

## 2020-09-05 NOTE — Significant Event (Signed)
Hypoglycemic Event  CBG: 65  Treatment: D50 25 mL (12.5 gm)  Symptoms: Pale  Follow-up CBG: Time:2206 CBG Result:160  Possible Reasons for Event: Vomiting and Inadequate meal intake  Comments/MD notified:J. Sidney Ace, MD    Benson [place]

## 2020-09-05 NOTE — Progress Notes (Signed)
PROGRESS NOTE                                                                                                                                                                                                             Patient Demographics:    Carrie Moses, is a 80 y.o. female, DOB - November 21, 1940, [id number]  Outpatient Primary MD for the patient is Hamrick, Lorin Mercy, MD   Admit date - 09/02/2020   LOS - 3  Chief Complaint  Patient presents with  . Shortness of Breath       Brief Narrative: Patient is a 80 y.o. female with PMHx of HTN, HLD, DM-2, CVA-presented with several days history of cough, fever, vomiting-upon further evaluation she was found to have acute hypoxic respiratory failure due to COVID-19 pneumonia and A. fib with RVR.   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 fib with RVR.  Significant studies: 5/12>>Chest x-ray: Bibasilar pneumonia. 5/13>> Echo: EF 0000000, grade 1 diastolic dysfunction.  COVID-19 medications: Steroids: 5/12>> Remdesivir: 5/12>>  Antibiotics: None  Microbiology data: 5/12>>Blood culture: No growth.  Procedures: None  Consults: Cardiology  DVT prophylaxis:  apixaban (ELIQUIS) tablet 5 mg     Subjective:   Continues to slowly improve-some cough-shortness of breath has improved markedly.  Remains on room air since yesterday.  CRP is trending down.   Assessment  & Plan :   Acute Hypoxic Resp Failure due to Covid 19 Viral pneumonia: Overall much improved-on room air-CRP now decreasing-start tapering steroids-continue Remdesivir.  If she continues to improve-suspect she could be discharged home on 5/16.  I  Fever: afebrile O2 requirements:  SpO2: 98 % O2 Flow Rate (L/min): 2 L/min   COVID-19 Labs: Recent Labs     09/02/20 1933 09/04/20 0109 09/05/20 0312  DDIMER 0.60* 0.59* 0.28  FERRITIN 36  --   --   LDH 133  --   --   CRP 0.6 12.9* 6.3*       Component Value Date/Time   BNP 187.4 (H) 09/02/2020 2236    Recent Labs  Lab 09/02/20 1933  PROCALCITON <0.10    Lab Results  Component Value Date   SARSCOV2NAA POSITIVE (A) 09/02/2020     Prone/Incentive Spirometry:  encouraged  incentive spirometry use 3-4/hour.  Sepsis: Due to COVID-19 pneumonia-sepsis physiology has resolved.  PAF with RVR: Remains in sinus rhythm-evaluated by cardiology-started on beta-blocker and Eliquis.  CHA2DS2-VASc of around 5.   History of CVA: No obvious functional deficits-see above regarding anticoagulation.  No longer on Plavix.  HTN: BP stable-continue losartan and amlodipine.  HLD: Continue statin  DM-2 (A1c 7.0 on 5/13) controlled hyperglycemia due to steroids: CBGs remain elevated-steroids being tapered down-have switched Lantus to 16 units twice daily, increased Premeal NovoLog to 8 units with meals-unchanged SSI to resistant scale.  Follow and assess-but suspect that CBGs will start downtrending as the steroids have been de-escalated.   Recent Labs    09/04/20 1644 09/04/20 2048 09/05/20 0812  GLUCAP 384* 399* 406*   History of Guillain-Barr syndrome/generalized weakness: Worsening weakness due to acute illness-evaluated by PT services-recommendations are for home PT.  At baseline mostly walks with a walker.   Obesity: Estimated body mass index is 37.2 kg/m as calculated from the following:   Height as of this encounter: 5\' 3"  (1.6 m).   Weight as of this encounter: 95.3 kg.     ABG: No results found for: PHART, PCO2ART, PO2ART, HCO3, TCO2, ACIDBASEDEF, O2SAT  Vent Settings: N/A  Condition - Stable  Family Communication  :  [phone] updated over the phone on 5/14  Code Status :  Full Code  Diet :  Diet Order            Diet heart healthy/carb modified Room  service appropriate? Yes; Fluid consistency: Thin  Diet effective now                  Disposition Plan  :   Status is: Inpatient  Remains inpatient appropriate because:Inpatient level of care appropriate due to severity of illness   Dispo: The patient is from: Home              Anticipated d/c is to: Home              Patient currently is not medically stable to d/c.   Difficult to place patient No    Barriers to discharge: Hypoxia requiring O2 supplementation/complete 5 days of IV Remdesivir  Antimicorbials  :    Anti-infectives (From admission, onward)   Start     Dose/Rate Route Frequency Ordered Stop   09/03/20 1000  remdesivir 100 mg in sodium chloride 0.9 % 100 mL IVPB       "Followed by" Linked Group Details   100 mg 200 mL/hr over 30 Minutes Intravenous Daily 09/02/20 2150 09/07/20 0959   09/02/20 2230  remdesivir 200 mg in sodium chloride 0.9% 250 mL IVPB       "Followed by" Linked Group Details   200 mg 580 mL/hr over 30 Minutes Intravenous Once 09/02/20 2150 09/02/20 2322      Inpatient Medications  Scheduled Meds: . amLODipine  5 mg Oral Daily  . apixaban  5 mg Oral BID  . vitamin C  500 mg Oral Daily  . cholecalciferol  1,000 Units Oral Daily  . insulin aspart  0-20 Units Subcutaneous TID WC  . insulin aspart  8 Units Subcutaneous TID WC  . insulin glargine  16 Units Subcutaneous BID  . losartan  50 mg Oral Daily  . metoprolol tartrate  12.5 mg Oral BID  . pravastatin  10 mg Oral QHS  . [START ON 09/06/2020] predniSONE  40 mg Oral Q breakfast  . sodium chloride flush  3 mL Intravenous Q12H  . zinc sulfate  220 mg Oral Daily   Continuous Infusions: . sodium chloride    . remdesivir 100 mg in NS 100 mL 100 mg (09/05/20 0914)   PRN Meds:.sodium chloride, guaiFENesin-dextromethorphan   Time Spent in minutes 25  See all Orders from today for further details   Oren Binet M.D on 09/05/2020 at 10:42 AM  To page go to [email or web address] - use  universal password  [facility] -  Office  [phone]    Objective:   Vitals:   09/04/20 2132 09/05/20 0000 09/05/20 0409 09/05/20 0815  BP:  116/60 139/84 (!) 163/94  Pulse: (!) 109 80 83   Resp:  17 18 16   Temp:  98 F (36.7 C) 97.8 F (36.6 C) 98 F (36.7 C)  TempSrc:  Axillary Oral Oral  SpO2:  90% 96% 98%  Weight:      Height:        Wt Readings from Last 3 Encounters:  09/02/20 95.3 kg  12/23/13 97.5 kg     Intake/Output Summary (Last 24 hours) at 09/05/2020 1042 Last data filed at 09/05/2020 0500 Gross per 24 hour  Intake 240 ml  Output 1945 ml  Net -1705 ml     Physical Exam Gen Exam:Alert awake-not in any distress HEENT:atraumatic, normocephalic Chest: B/L clear to auscultation anteriorly CVS:S1S2 regular Abdomen:soft non tender, non distended Extremities:no edema Neurology: Non focal Skin: no rash   Data Review:    CBC Recent Labs  Lab 09/02/20 1933 09/04/20 0109 09/05/20 0312  WBC 11.3* 12.5* 10.1  HGB 13.5 12.4 13.3  HCT 41.7 37.3 39.0  PLT 225 238 274  MCV 93.5 91.2 88.2  MCH 30.3 30.3 30.1  MCHC 32.4 33.2 34.1  RDW 12.1 12.1 11.9  LYMPHSABS 0.5* 1.4 1.0  MONOABS 0.5 0.5 0.5  EOSABS 0.0 0.0 0.0  BASOSABS 0.0 0.0 0.0    Chemistries  Recent Labs  Lab 09/02/20 1933 09/04/20 0109 09/05/20 0312  NA 135 133* 132*  K 4.1 4.5 4.8  CL 101 96* 96*  CO2 25 29 31   GLUCOSE 224* 190* 413*  BUN 24* 26* 30*  CREATININE 0.84 0.75 0.79  CALCIUM 8.6* 9.1 9.2  MG  --  1.6*  --   AST 19 15 14*  ALT 15 13 13   ALKPHOS 46 40 51  BILITOT 0.9 0.7 0.4   ------------------------------------------------------------------------------------------------------------------ Recent Labs    09/02/20 1934  TRIG 61    Lab Results  Component Value Date   HGBA1C 7.0 (H) 09/03/2020   ------------------------------------------------------------------------------------------------------------------ Recent Labs    09/03/20 0447  TSH  0.909   ------------------------------------------------------------------------------------------------------------------ Recent Labs    09/02/20 1933  FERRITIN 36    Coagulation profile No results for input(s): INR, PROTIME in the last 168 hours.  Recent Labs    09/04/20 0109 09/05/20 0312  DDIMER 0.59* 0.28    Cardiac Enzymes No results for input(s): CKMB, TROPONINI, MYOGLOBIN in the last 168 hours.  Invalid input(s): CK ------------------------------------------------------------------------------------------------------------------    Component Value Date/Time   BNP 187.4 (H) 09/02/2020 2236    Micro Results Recent Results (from the past 240 hour(s))  Resp Panel by RT-PCR (Flu A&B, Covid) Nasopharyngeal Swab     Status: Abnormal   Collection Time: 09/02/20  6:48 PM   Specimen: Nasopharyngeal Swab; Nasopharyngeal(NP) swabs in vial transport medium  Result Value Ref Range Status   SARS Coronavirus 2 by RT PCR POSITIVE (A) NEGATIVE Final    Comment: RESULT  CALLED TO, READ BACK BY AND VERIFIED WITH: Katheran Awe RN 2107 09/02/20 A BROWNING (NOTE) SARS-CoV-2 target nucleic acids are DETECTED.  The SARS-CoV-2 RNA is generally detectable in upper respiratory specimens during the acute phase of infection. Positive results are indicative of the presence of the identified virus, but do not rule out bacterial infection or co-infection with other pathogens not detected by the test. Clinical correlation with patient history and other diagnostic information is necessary to determine patient infection status. The expected result is Negative.  Fact Sheet for Patients: EntrepreneurPulse.com.au  Fact Sheet for Healthcare Providers: IncredibleEmployment.be  This test is not yet approved or cleared by the [place] FDA and  has been authorized for detection and/or diagnosis of SARS-CoV-2 by FDA under an Emergency Use Authorization (EUA).   This EUA will remain in effect (meaning this test can  be used) for the duration of  the COVID-19 declaration under Section 564(b)(1) of the Act, 21 U.S.C. section 360bbb-3(b)(1), unless the authorization is terminated or revoked sooner.     Influenza A by PCR NEGATIVE NEGATIVE Final   Influenza B by PCR NEGATIVE NEGATIVE Final    Comment: (NOTE) The Xpert Xpress SARS-CoV-2/FLU/RSV plus assay is intended as an aid in the diagnosis of influenza from Nasopharyngeal swab specimens and should not be used as a sole basis for treatment. Nasal washings and aspirates are unacceptable for Xpert Xpress SARS-CoV-2/FLU/RSV testing.  Fact Sheet for Patients: EntrepreneurPulse.com.au  Fact Sheet for Healthcare Providers: IncredibleEmployment.be  This test is not yet approved or cleared by the [place] FDA and has been authorized for detection and/or diagnosis of SARS-CoV-2 by FDA under an Emergency Use Authorization (EUA). This EUA will remain in effect (meaning this test can be used) for the duration of the COVID-19 declaration under Section 564(b)(1) of the Act, 21 U.S.C. section 360bbb-3(b)(1), unless the authorization is terminated or revoked.  Performed at [facility], [place] [address] [place], [place] [address]   Blood Culture (routine x 2)     Status: None (Preliminary result)   Collection Time: 09/02/20  6:53 PM   Specimen: BLOOD  Result Value Ref Range Status   Specimen Description BLOOD SITE NOT SPECIFIED  Final   Special Requests   Final    BOTTLES DRAWN AEROBIC AND ANAEROBIC Blood Culture results may not be optimal due to an inadequate volume of blood received in culture bottles   Culture   Final    NO GROWTH 2 DAYS Performed at [facility], [place] [address] [place], [place] [address]    Report Status PENDING  Incomplete  Blood Culture (routine x 2)     Status: None (Preliminary result)   Collection Time: 09/02/20 11:25  PM   Specimen: BLOOD  Result Value Ref Range Status   Specimen Description BLOOD SITE NOT SPECIFIED  Final   Special Requests   Final    BOTTLES DRAWN AEROBIC AND ANAEROBIC Blood Culture results may not be optimal due to an inadequate volume of blood received in culture bottles   Culture   Final    NO GROWTH 2 DAYS Performed at [facility], [place] [address], [place], [place] [address]    Report Status PENDING  Incomplete    Radiology Reports DG Chest Port 1 View  Result Date: 09/02/2020 CLINICAL DATA:  Dyspnea.  Positive home COVID test. EXAM: PORTABLE CHEST 1 VIEW COMPARISON:  12/23/2013 FINDINGS: Interval patchy opacity in both lower lung zones. No pleural fluid. Enlarged  cardiac silhouette with a mild increase in size. Mild increase in prominence of the pulmonary vasculature. Unremarkable bones. IMPRESSION: 1. Mildly progressive cardiomegaly and pulmonary vascular congestion. 2. Bibasilar pneumonia.  Alveolar edema is less likely. Electronically Signed   By: Claudie Revering M.D.   On: 09/02/2020 19:18   ECHOCARDIOGRAM COMPLETE  Result Date: 09/03/2020    ECHOCARDIOGRAM REPORT   Patient Name:   [facility] [place] Date of Exam: 09/03/2020 Medical Rec #:  [id number]            Height:       63.0 in Accession #:    [id number]           Weight:       210.0 lb Date of Birth:  [birth year]            BSA:          1.974 m Patient Age:    66 years             BP:           129/71 mmHg Patient Gender: F                    HR:           112 bpm. Exam Location:  Inpatient Procedure: 2D Echo, Cardiac Doppler, Color Doppler and Intracardiac            Opacification Agent Indications:    Atrial Fibrillation I48.91  History:        Patient has prior history of Echocardiogram examinations, most                 recent 12/24/2013. Risk Factors:Hypertension, Diabetes and                 Dyslipidemia. Pneumonia due to COVID-19 virus. [place].  Sonographer:    Darlina Sicilian RDCS Referring Phys: [id number] [place]  1. Left ventricular ejection fraction, by estimation, is 55 to 60%. The left ventricle has normal function. The left ventricle has no regional wall motion abnormalities. There is mild left ventricular hypertrophy. Left ventricular diastolic parameters are consistent with Grade I diastolic dysfunction (impaired relaxation).  2. Right ventricular systolic function is normal. The right ventricular size is mildly enlarged. There is mildly elevated pulmonary artery systolic pressure. The estimated right ventricular systolic pressure is 72.0 mmHg.  3. Left atrial size was mildly dilated.  4. The mitral valve is normal in structure. Trivial mitral valve regurgitation. No evidence of mitral stenosis.  5. The aortic valve is tricuspid. Aortic valve regurgitation is not visualized. No aortic stenosis is present.  6. The inferior vena cava is normal in size with greater than 50% respiratory variability, suggesting right atrial pressure of 3 mmHg. FINDINGS  Left Ventricle: Left ventricular ejection fraction, by estimation, is 55 to 60%. The left ventricle has normal function. The left ventricle has no regional wall motion abnormalities. Definity contrast agent was given IV to delineate the left ventricular  endocardial borders. The left ventricular internal cavity size was normal in size. There is mild left ventricular hypertrophy. Left ventricular diastolic parameters are consistent with Grade I diastolic dysfunction (impaired relaxation). Right Ventricle: The right ventricular size is mildly enlarged. No increase in right ventricular wall thickness. Right ventricular systolic function is normal. There is mildly elevated pulmonary artery systolic pressure. The tricuspid regurgitant velocity is 2.91 m/s, and with an assumed right atrial pressure of 3 mmHg, the estimated right ventricular systolic  pressure is 36.9 mmHg. Left Atrium: Left atrial size was mildly dilated. Right Atrium: Right atrial size was  normal in size. Pericardium: There is no evidence of pericardial effusion. Mitral Valve: The mitral valve is normal in structure. Mild mitral annular calcification. Trivial mitral valve regurgitation. No evidence of mitral valve stenosis. Tricuspid Valve: The tricuspid valve is normal in structure. Tricuspid valve regurgitation is mild. Aortic Valve: The aortic valve is tricuspid. Aortic valve regurgitation is not visualized. No aortic stenosis is present. Pulmonic Valve: The pulmonic valve was normal in structure. Pulmonic valve regurgitation is not visualized. Aorta: The aortic root is normal in size and structure. Venous: The inferior vena cava is normal in size with greater than 50% respiratory variability, suggesting right atrial pressure of 3 mmHg. IAS/Shunts: No atrial level shunt detected by color flow Doppler.  LEFT VENTRICLE PLAX 2D LVIDd:         4.00 cm     Diastology LVIDs:         2.60 cm     LV e' medial:    4.24 cm/s LV PW:         0.80 cm     LV E/e' medial:  14.3 LV IVS:        1.40 cm     LV e' lateral:   5.66 cm/s LVOT diam:     2.10 cm     LV E/e' lateral: 10.7 LV SV:         66 LV SV Index:   34 LVOT Area:     3.46 cm  LV Volumes (MOD) LV vol d, MOD A2C: 81.3 ml LV vol d, MOD A4C: 91.9 ml LV vol s, MOD A2C: 16.2 ml LV vol s, MOD A4C: 20.0 ml LV SV MOD A2C:     65.1 ml LV SV MOD A4C:     91.9 ml LV SV MOD BP:      68.7 ml RIGHT VENTRICLE RV S prime:     13.20 cm/s LEFT ATRIUM             Index LA diam:        3.20 cm 1.62 cm/m LA Vol (A2C):   34.2 ml 17.32 ml/m LA Vol (A4C):   42.2 ml 21.38 ml/m LA Biplane Vol: 40.0 ml 20.26 ml/m  AORTIC VALVE LVOT Vmax:   102.00 cm/s LVOT Vmean:  75.700 cm/s LVOT VTI:    0.191 m  AORTA Ao Root diam: 3.00 cm Ao Asc diam:  3.10 cm MITRAL VALVE               TRICUSPID VALVE MV Area (PHT): 3.08 cm    TR Peak grad:   33.9 mmHg MV Decel Time: 246 msec    TR Vmax:        291.00 cm/s MV E velocity: 60.60 cm/s MV A velocity: 73.70 cm/s  SHUNTS MV E/A ratio:  0.82         Systemic VTI:  0.19 m                            Systemic Diam: 2.10 cm Loralie Champagne MD Electronically signed by Loralie Champagne MD Signature Date/Time: 09/03/2020/4:05:10 PM    Final

## 2020-09-06 ENCOUNTER — Other Ambulatory Visit (HOSPITAL_COMMUNITY): Payer: Self-pay

## 2020-09-06 LAB — GLUCOSE, CAPILLARY
Glucose-Capillary: 374 mg/dL — ABNORMAL HIGH (ref 70–99)
Glucose-Capillary: 163 mg/dL — ABNORMAL HIGH (ref 70–99)
Glucose-Capillary: 235 mg/dL — ABNORMAL HIGH (ref 70–99)

## 2020-09-06 LAB — COMPREHENSIVE METABOLIC PANEL
ALT: 13 U/L (ref 0–44)
AST: 18 U/L (ref 15–41)
Albumin: 3.1 g/dL — ABNORMAL LOW (ref 3.5–5.0)
Alkaline Phosphatase: 49 U/L (ref 38–126)
Anion gap: 11 (ref 5–15)
BUN: 25 mg/dL — ABNORMAL HIGH (ref 8–23)
CO2: 31 mmol/L (ref 22–32)
Calcium: 9 mg/dL (ref 8.9–10.3)
Chloride: 94 mmol/L — ABNORMAL LOW (ref 98–111)
Creatinine, Ser: 0.67 mg/dL (ref 0.44–1.00)
GFR, Estimated: >60 mL/min (ref >60)
Glucose, Bld: 92 mg/dL (ref 70–99)
Potassium: 4 mmol/L (ref 3.5–5.1)
Sodium: 136 mmol/L (ref 135–145)
Total Bilirubin: 0.7 mg/dL (ref 0.3–1.2)
Total Protein: 6.2 g/dL — ABNORMAL LOW (ref 6.5–8.1)

## 2020-09-06 LAB — C-REACTIVE PROTEIN: CRP: 2.9 mg/dL — ABNORMAL HIGH (ref <1.0)

## 2020-09-06 LAB — CBC WITH DIFFERENTIAL/PLATELET
Abs Immature Granulocytes: 0.1 10*3/uL — ABNORMAL HIGH (ref 0.00–0.07)
Basophils Absolute: 0 10*3/uL (ref 0.0–0.1)
Basophils Relative: 0 %
Eosinophils Absolute: 0 10*3/uL (ref 0.0–0.5)
Eosinophils Relative: 0 %
HCT: 43 % (ref 36.0–46.0)
Hemoglobin: 14.5 g/dL (ref 12.0–15.0)
Immature Granulocytes: 1 %
Lymphocytes Relative: 16 %
Lymphs Abs: 3.3 10*3/uL (ref 0.7–4.0)
MCH: 30 pg (ref 26.0–34.0)
MCHC: 33.7 g/dL (ref 30.0–36.0)
MCV: 89 fL (ref 80.0–100.0)
Monocytes Absolute: 1.8 10*3/uL — ABNORMAL HIGH (ref 0.1–1.0)
Monocytes Relative: 9 %
Neutro Abs: 15.8 10*3/uL — ABNORMAL HIGH (ref 1.7–7.7)
Neutrophils Relative %: 74 %
Platelets: 370 10*3/uL (ref 150–400)
RBC: 4.83 MIL/uL (ref 3.87–5.11)
RDW: 11.9 % (ref 11.5–15.5)
WBC: 21 10*3/uL — ABNORMAL HIGH (ref 4.0–10.5)
nRBC: 0 % (ref 0.0–0.2)

## 2020-09-06 LAB — D-DIMER, QUANTITATIVE: D-Dimer, Quant: 0.37 ug/mL-FEU (ref 0.00–0.50)

## 2020-09-06 MED ORDER — INSULIN ASPART 100 UNIT/ML IJ SOLN
0.0000 [IU] | Freq: Three times a day (TID) | INTRAMUSCULAR | Status: DC
  Administered 2020-09-06: 2 [IU] via SUBCUTANEOUS
  Administered 2020-09-06: 3 [IU] via SUBCUTANEOUS

## 2020-09-06 MED ORDER — METOPROLOL TARTRATE 25 MG PO TABS
12.5000 mg | ORAL_TABLET | Freq: Two times a day (BID) | ORAL | 0 refills | Status: AC
  Filled 2020-09-06: qty 60, 60d supply, fill #0

## 2020-09-06 MED ORDER — INSULIN GLARGINE 100 UNIT/ML ~~LOC~~ SOLN
10.0000 [IU] | Freq: Two times a day (BID) | SUBCUTANEOUS | Status: DC
  Administered 2020-09-06: 10 [IU] via SUBCUTANEOUS
  Filled 2020-09-06 (×2): qty 0.1

## 2020-09-06 MED ORDER — BENZONATATE 100 MG PO CAPS
100.0000 mg | ORAL_CAPSULE | Freq: Three times a day (TID) | ORAL | 0 refills | Status: AC | PRN
  Filled 2020-09-06: qty 30, 10d supply, fill #0

## 2020-09-06 MED ORDER — ALBUTEROL SULFATE HFA 108 (90 BASE) MCG/ACT IN AERS
2.0000 | INHALATION_SPRAY | Freq: Four times a day (QID) | RESPIRATORY_TRACT | 0 refills | Status: AC | PRN
  Filled 2020-09-06: qty 18, 25d supply, fill #0

## 2020-09-06 MED ORDER — APIXABAN 5 MG PO TABS
5.0000 mg | ORAL_TABLET | Freq: Two times a day (BID) | ORAL | 0 refills | Status: AC
  Filled 2020-09-06: qty 60, 30d supply, fill #0

## 2020-09-06 MED ORDER — PREDNISONE 10 MG PO TABS
ORAL_TABLET | ORAL | 0 refills | Status: AC
  Filled 2020-09-06: qty 10, 4d supply, fill #0

## 2020-09-06 NOTE — Discharge Summary (Addendum)
PATIENT DETAILS Name: Carrie Moses Age: 80 y.o. Sex: female Date of Birth: 05/01/1940 MRN: [id number]. Admitting Physician: Shela Leff, MD [id number], Lorin Mercy, MD  Admit Date: 09/02/2020 Discharge date: 09/06/2020  Recommendations for Outpatient Follow-up:  1. Follow up with PCP in 1-2 weeks 2. Please obtain CMP/CBC in one week 3. Repeat Chest Xray in 4-6 week   Admitted From:  Home  Disposition: Home with home health [place]:  Yes  Equipment/Devices: None  Discharge Condition: Stable  CODE STATUS: FULL CODE  Diet recommendation:  Diet Order            Diet - low sodium heart healthy           Diet Carb Modified           Diet heart healthy/carb modified Room service appropriate? Yes; Fluid consistency: Thin  Diet effective now                 Brief Narrative: Patient is a 80 y.o. female with PMHx of HTN, HLD, DM-2, CVA-presented with several days history of cough, fever, vomiting-upon further evaluation she was found to have acute hypoxic respiratory failure due to COVID-19 pneumonia and A. fib with RVR.   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 fib with RVR.  Significant studies: 5/12>>Chest x-ray: Bibasilar pneumonia. 5/13>> Echo: EF 0000000, grade 1 diastolic dysfunction.  COVID-19 medications: Steroids: 5/12>> Remdesivir: 5/12>>5/16  Antibiotics: None  Microbiology data: 5/12>>Blood culture: No growth.  Procedures: None  Consults: Cardiology   Brief Hospital Course: Acute Hypoxic Resp Failure due to Covid 19 Viral pneumonia:  Clinically improved-on room air for the past several days-we will complete Remdesivir on 5/16-plan is to discharge home on tapering steroids.  Although she is on room air and clinically improved-she requires around 2 L  of oxygen with ambulation-hence will be discharged home with daughter.  PCP to reassess at next visit whether she still requires oxygen.  COVID-19 Labs:  Recent Labs    09/04/20 0109 09/05/20 0312 09/06/20 0043  DDIMER 0.59* 0.28 0.37  CRP 12.9* 6.3* 2.9*    Lab Results  Component Value Date   SARSCOV2NAA POSITIVE (A) 09/02/2020     Sepsis: Due to COVID-19 pneumonia-sepsis physiology has resolved.  PAF with RVR: Remains in sinus rhythm-evaluated by cardiology-started on beta-blocker and Eliquis.  CHA2DS2-VASc of around 5.   History of CVA: No obvious functional deficits-see above regarding anticoagulation.  No longer on Plavix.  HTN: BP stable-continue losartan and amlodipine.  HLD: Continue statin  DM-2 (A1c 7.0 on 5/13) controlled hyperglycemia due to steroids:  CBGs were elevated due to steroids-she was managed with Lantus/NovoLog and SSI-she had an episode of hypoglycemia in the setting of transient vomiting and titrating down steroid dosage.  CBGs have improved on 5/16.  Per patient she is no longer on insulin-and is only on oral hypoglycemic agents-suspect she can be placed on her usual regimen on discharge.  PCP to follow and adjust further.   History of Guillain-Barr syndrome/generalized weakness: Worsening weakness due to acute illness-evaluated by PT services-recommendations are for home PT.  At baseline mostly walks with a walker.   Obesity: Estimated body mass index is 37.2 kg/m as calculated from the following:   Height as of this encounter: 5\' 3"  (1.6 m).   Weight as of this encounter: 95.3 kg  Discharge Diagnoses:  Principal Problem:   Pneumonia due to COVID-19 virus Active Problems:   HTN (hypertension)   DM (diabetes mellitus) ([place])   Sepsis [facility])   Arrhythmia   Discharge Instructions:    Person Under Monitoring Name: Yarisel Kuruc [facility]  Location: [place] [address]   Infection Prevention Recommendations for  Individuals Confirmed to have, or Being Evaluated for, 2019 Novel Coronavirus (COVID-19) Infection Who Receive Care at Home  Individuals who are confirmed to have, or are being evaluated for, COVID-19 should follow the prevention steps below until a healthcare provider or local or state health department says they can return to normal activities.  Stay home except to get medical care You should restrict activities outside your home, except for getting medical care. Do not go to work, school, or public areas, and do not use public transportation or taxis.  Call ahead before visiting your doctor Before your medical appointment, call the healthcare provider and tell them that you have, or are being evaluated for, COVID-19 infection. This will help the healthcare provider's office take steps to keep other people from getting infected. Ask your healthcare provider to call the local or state health department.  Monitor your symptoms Seek prompt medical attention if your illness is worsening (e.g., difficulty breathing). Before going to your medical appointment, call the healthcare provider and tell them that you have, or are being evaluated for, COVID-19 infection. Ask your healthcare provider to call the local or state health department.  Wear a facemask You should wear a facemask that covers your nose and mouth when you are in the same room with other people and when you visit a healthcare provider. People who live with or visit you should also wear a facemask while they are in the same room with you.  Separate yourself from other people in your home As much as possible, you should stay in a different room from other people in your home. Also, you should use a separate bathroom, if available.  Avoid sharing household items You should not share dishes, drinking glasses, cups, eating utensils, towels, bedding, or other items with other people in your home. After using these items, you  should wash them thoroughly with soap and water.  Cover your coughs and sneezes Cover your mouth and nose with a tissue when you cough or sneeze, or you can cough or sneeze into your sleeve. Throw used tissues in a lined trash can, and immediately wash your hands with soap and water for at least 20 seconds or use an alcohol-based hand rub.  Wash your [facility] your hands often and thoroughly with soap and water for at least 20 seconds. You can use an alcohol-based hand sanitizer if soap and water are not available and if your hands are not visibly dirty. Avoid touching your eyes, nose, and mouth with unwashed hands.   Prevention Steps for Caregivers and Household Members of Individuals Confirmed to have, or Being Evaluated for, COVID-19 Infection Being Cared for in the Home  If you live with, or provide care at home for, a person confirmed to have, or being evaluated for, COVID-19 infection please follow these guidelines to prevent infection:  Follow healthcare provider's instructions Make sure that you understand and can help the patient follow any healthcare provider instructions for all care.  Provide for the patient's basic needs You should help the patient with basic needs in the home and provide support for getting groceries, prescriptions, and other personal  needs.  Monitor the patient's symptoms If they are getting sicker, call his or her medical provider and tell them that the patient has, or is being evaluated for, COVID-19 infection. This will help the healthcare provider's office take steps to keep other people from getting infected. Ask the healthcare provider to call the local or state health department.  Limit the number of people who have contact with the patient  If possible, have only one caregiver for the patient.  Other household members should stay in another home or place of residence. If this is not possible, they should stay  in another room, or be  separated from the patient as much as possible. Use a separate bathroom, if available.  Restrict visitors who do not have an essential need to be in the home.  Keep older adults, very young children, and other sick people away from the patient Keep older adults, very young children, and those who have compromised immune systems or chronic health conditions away from the patient. This includes people with chronic heart, lung, or kidney conditions, diabetes, and cancer.  Ensure good ventilation Make sure that shared spaces in the home have good air flow, such as from an air conditioner or an opened window, weather permitting.  Wash your hands often  Wash your hands often and thoroughly with soap and water for at least 20 seconds. You can use an alcohol based hand sanitizer if soap and water are not available and if your hands are not visibly dirty.  Avoid touching your eyes, nose, and mouth with unwashed hands.  Use disposable paper towels to dry your hands. If not available, use dedicated cloth towels and replace them when they become wet.  Wear a facemask and gloves  Wear a disposable facemask at all times in the room and gloves when you touch or have contact with the patient's blood, body fluids, and/or secretions or excretions, such as sweat, saliva, sputum, nasal mucus, vomit, urine, or feces.  Ensure the mask fits over your nose and mouth tightly, and do not touch it during use.  Throw out disposable facemasks and gloves after using them. Do not reuse.  Wash your hands immediately after removing your facemask and gloves.  If your personal clothing becomes contaminated, carefully remove clothing and launder. Wash your hands after handling contaminated clothing.  Place all used disposable facemasks, gloves, and other waste in a lined container before disposing them with other household waste.  Remove gloves and wash your hands immediately after handling these items.  Do not share  dishes, glasses, or other household items with the patient  Avoid sharing household items. You should not share dishes, drinking glasses, cups, eating utensils, towels, bedding, or other items with a patient who is confirmed to have, or being evaluated for, COVID-19 infection.  After the person uses these items, you should wash them thoroughly with soap and water.  Wash laundry thoroughly  Immediately remove and wash clothes or bedding that have blood, body fluids, and/or secretions or excretions, such as sweat, saliva, sputum, nasal mucus, vomit, urine, or feces, on them.  Wear gloves when handling laundry from the patient.  Read and follow directions on labels of laundry or clothing items and detergent. In general, wash and dry with the warmest temperatures recommended on the label.  Clean all areas the individual has used often  Clean all touchable surfaces, such as counters, tabletops, doorknobs, bathroom fixtures, toilets, phones, keyboards, tablets, and bedside tables, every day. Also, clean any  surfaces that may have blood, body fluids, and/or secretions or excretions on them.  Wear gloves when cleaning surfaces the patient has come in contact with.  Use a diluted bleach solution (e.g., dilute bleach with 1 part bleach and 10 parts water) or a household disinfectant with a label that says EPA-registered for coronaviruses. To make a bleach solution at home, add 1 tablespoon of bleach to 1 quart (4 cups) of water. For a larger supply, add  cup of bleach to 1 gallon (16 cups) of water.  Read labels of cleaning products and follow recommendations provided on product labels. Labels contain instructions for safe and effective use of the cleaning product including precautions you should take when applying the product, such as wearing gloves or eye protection and making sure you have good ventilation during use of the product.  Remove gloves and wash hands immediately after  cleaning.  Monitor yourself for signs and symptoms of illness Caregivers and household members are considered close contacts, should monitor their health, and will be asked to limit movement outside of the home to the extent possible. Follow the monitoring steps for close contacts listed on the symptom monitoring form.   ? If you have additional questions, contact your local health department or call the epidemiologist on call at [phone] (available 24/7). ? This guidance is subject to change. For the most up-to-date guidance from CDC, please refer to their website: YouBlogs.pl    Activity:  As tolerated with Full fall precautions use walker/cane & assistance as needed   Discharge Instructions    Call MD for:  difficulty breathing, headache or visual disturbances   Complete by: As directed    Diet - low sodium heart healthy   Complete by: As directed    Diet Carb Modified   Complete by: As directed    Discharge instructions   Complete by: As directed    Follow with Primary MD  Hamrick, Maura L, MD in 1-2 weeks  Please get a complete blood count and chemistry panel checked by your Primary MD at your next visit, and again as instructed by your Primary MD.  Get Medicines reviewed and adjusted: Please take all your medications with you for your next visit with your Primary MD  Laboratory/radiological data: Please request your Primary MD to go over all hospital tests and procedure/radiological results at the follow up, please ask your Primary MD to get all Hospital records sent to his/her office.  In some cases, they will be blood work, cultures and biopsy results pending at the time of your discharge. Please request that your primary care M.D. follows up on these results.  Also Note the following: If you experience worsening of your admission symptoms, develop shortness of breath, life threatening emergency,  suicidal or homicidal thoughts you must seek medical attention immediately by calling 911 or calling your MD immediately  if symptoms less severe.  You must read complete instructions/literature along with all the possible adverse reactions/side effects for all the Medicines you take and that have been prescribed to you. Take any new Medicines after you have completely understood and accpet all the possible adverse reactions/side effects.   Do not drive when taking Pain medications or sleeping medications (Benzodaizepines)  Do not take more than prescribed Pain, Sleep and Anxiety Medications. It is not advisable to combine anxiety,sleep and pain medications without talking with your primary care practitioner  Special Instructions: If you have smoked or chewed Tobacco  in the last 2 yrs please  stop smoking, stop any regular Alcohol  and or any Recreational drug use.  Wear Seat belts while driving.  Please note: You were cared for by a hospitalist during your hospital stay. Once you are discharged, your primary care physician will handle any further medical issues. Please note that NO REFILLS for any discharge medications will be authorized once you are discharged, as it is imperative that you return to your primary care physician (or establish a relationship with a primary care physician if you do not have one) for your post hospital discharge needs so that they can reassess your need for medications and monitor your lab values.   1.)  21 days of isolation from the day of your first positive COVID test or from the first day of your symptoms.  2.)  If you develop worsening shortness of breath-please seek immediate medical attention.  3.)  You were no longer on Plavix-which has been discontinued-you have been placed on Eliquis.  4.)  Before you run out of your Eliquis-please contact your primary care practitioner's office for further refills.   Increase activity slowly   Complete by: As directed       Allergies as of 09/06/2020      Reactions   Other Other (See Comments)   Bell peppers cause nausea and vomiting      Medication List    STOP taking these medications   clopidogrel 75 MG tablet Commonly known as: PLAVIX   insulin aspart 100 UNIT/ML injection Commonly known as: NovoLOG   insulin detemir 100 UNIT/ML injection Commonly known as: LEVEMIR     TAKE these medications   albuterol 108 (90 Base) MCG/ACT inhaler Commonly known as: VENTOLIN HFA Inhale 2 puffs into the lungs every 6 (six) hours as needed for wheezing or shortness of breath.   amLODipine 5 MG tablet Commonly known as: NORVASC Take 5 mg by mouth daily.   benzonatate 100 MG capsule Commonly known as: Tessalon Perles Take 1 capsule (100 mg total) by mouth 3 (three) times daily as needed for cough.   Eliquis 5 MG Tabs tablet Generic drug: apixaban Take 1 tablet (5 mg total) by mouth 2 (two) times daily.   glipiZIDE-metformin 5-500 MG tablet Commonly known as: METAGLIP Take 2 tablets by mouth 2 (two) times daily.   losartan 100 MG tablet Commonly known as: COZAAR Take 100 mg by mouth daily.   metoprolol tartrate 25 MG tablet Commonly known as: LOPRESSOR Take 1/2 tablet (12.5 mg total) by mouth two times daily.   pioglitazone 15 MG tablet Commonly known as: ACTOS Take 15 mg by mouth daily.   pravastatin 10 MG tablet Commonly known as: PRAVACHOL Take 10 mg by mouth at bedtime.   predniSONE 10 MG tablet Commonly known as: DELTASONE Take 4 tablets (40 mg total) daily for 1 day, 3 tabs (30 mg total) daily for 1 day, 2 tabs (20 mg total) daily for 1 days, 1 tab (10 mg) daily for 1 day, then stop            Durable Medical Equipment  (From admission, onward)         Start     Ordered   09/06/20 1148  For home use only DME oxygen  Once       Question Answer Comment  Length of Need 6 Months   Mode or (Route) Nasal cannula   Liters per Minute 2   Oxygen delivery system Gas       09/06/20 1147  Follow-up Information    Hamrick, Lorin Mercy, MD. Schedule an appointment as soon as possible for a visit in 1 week(s).   Specialty: Family Medicine Contact information: [place] [address] [phone]        Werner Lean, MD Follow up in 2 week(s).   Specialty: Cardiology Contact information: [address] [place] [address] [phone]        Care, [facility] Follow up.   Specialty: [place] Why: A representative from [facility] will contact you to arrange start date and time for your therapy. Contact information: [place] [address] [phone]              Allergies  Allergen Reactions  . Other Other (See Comments)    Bell peppers cause nausea and vomiting     Other Procedures/Studies: DG Chest Port 1 View  Result Date: 09/02/2020 CLINICAL DATA:  Dyspnea.  Positive home COVID test. EXAM: PORTABLE CHEST 1 VIEW COMPARISON:  12/23/2013 FINDINGS: Interval patchy opacity in both lower lung zones. No pleural fluid. Enlarged cardiac silhouette with a mild increase in size. Mild increase in prominence of the pulmonary vasculature. Unremarkable bones. IMPRESSION: 1. Mildly progressive cardiomegaly and pulmonary vascular congestion. 2. Bibasilar pneumonia.  Alveolar edema is less likely. Electronically Signed   By: Claudie Revering M.D.   On: 09/02/2020 19:18   ECHOCARDIOGRAM COMPLETE  Result Date: 09/03/2020    ECHOCARDIOGRAM REPORT   Patient Name:   [facility] Date of Exam: 09/03/2020 Medical Rec #:  [id number]            Height:       63.0 in Accession #:    [id number]           Weight:       210.0 lb Date of Birth:  [birth year]            BSA:          1.974 m Patient Age:    6 years             BP:           129/71 mmHg Patient Gender: F                    HR:           112 bpm. Exam Location:  Inpatient Procedure: 2D Echo, Cardiac Doppler,  Color Doppler and Intracardiac            Opacification Agent Indications:    Atrial Fibrillation I48.91  History:        Patient has prior history of Echocardiogram examinations, most                 recent 12/24/2013. Risk Factors:Hypertension, Diabetes and                 Dyslipidemia. Pneumonia due to COVID-19 virus. [place].  Sonographer:    Darlina Sicilian RDCS Referring Phys: [id number] [place]  1. Left ventricular ejection fraction, by estimation, is 55 to 60%. The left ventricle has normal function. The left ventricle has no regional wall motion abnormalities. There is mild left ventricular hypertrophy. Left ventricular diastolic parameters are consistent with Grade I diastolic dysfunction (impaired relaxation).  2. Right ventricular systolic function is normal. The right ventricular size is mildly enlarged. There is mildly elevated pulmonary artery systolic pressure. The estimated right ventricular systolic  pressure is 36.9 mmHg.  3. Left atrial size was mildly dilated.  4. The mitral valve is normal in structure. Trivial mitral valve regurgitation. No evidence of mitral stenosis.  5. The aortic valve is tricuspid. Aortic valve regurgitation is not visualized. No aortic stenosis is present.  6. The inferior vena cava is normal in size with greater than 50% respiratory variability, suggesting right atrial pressure of 3 mmHg. FINDINGS  Left Ventricle: Left ventricular ejection fraction, by estimation, is 55 to 60%. The left ventricle has normal function. The left ventricle has no regional wall motion abnormalities. Definity contrast agent was given IV to delineate the left ventricular  endocardial borders. The left ventricular internal cavity size was normal in size. There is mild left ventricular hypertrophy. Left ventricular diastolic parameters are consistent with Grade I diastolic dysfunction (impaired relaxation). Right Ventricle: The right ventricular size is mildly enlarged. No  increase in right ventricular wall thickness. Right ventricular systolic function is normal. There is mildly elevated pulmonary artery systolic pressure. The tricuspid regurgitant velocity is 2.91 m/s, and with an assumed right atrial pressure of 3 mmHg, the estimated right ventricular systolic pressure is 64.3 mmHg. Left Atrium: Left atrial size was mildly dilated. Right Atrium: Right atrial size was normal in size. Pericardium: There is no evidence of pericardial effusion. Mitral Valve: The mitral valve is normal in structure. Mild mitral annular calcification. Trivial mitral valve regurgitation. No evidence of mitral valve stenosis. Tricuspid Valve: The tricuspid valve is normal in structure. Tricuspid valve regurgitation is mild. Aortic Valve: The aortic valve is tricuspid. Aortic valve regurgitation is not visualized. No aortic stenosis is present. Pulmonic Valve: The pulmonic valve was normal in structure. Pulmonic valve regurgitation is not visualized. Aorta: The aortic root is normal in size and structure. Venous: The inferior vena cava is normal in size with greater than 50% respiratory variability, suggesting right atrial pressure of 3 mmHg. IAS/Shunts: No atrial level shunt detected by color flow Doppler.  LEFT VENTRICLE PLAX 2D LVIDd:         4.00 cm     Diastology LVIDs:         2.60 cm     LV e' medial:    4.24 cm/s LV PW:         0.80 cm     LV E/e' medial:  14.3 LV IVS:        1.40 cm     LV e' lateral:   5.66 cm/s LVOT diam:     2.10 cm     LV E/e' lateral: 10.7 LV SV:         66 LV SV Index:   34 LVOT Area:     3.46 cm  LV Volumes (MOD) LV vol d, MOD A2C: 81.3 ml LV vol d, MOD A4C: 91.9 ml LV vol s, MOD A2C: 16.2 ml LV vol s, MOD A4C: 20.0 ml LV SV MOD A2C:     65.1 ml LV SV MOD A4C:     91.9 ml LV SV MOD BP:      68.7 ml RIGHT VENTRICLE RV S prime:     13.20 cm/s LEFT ATRIUM             Index LA diam:        3.20 cm 1.62 cm/m LA Vol (A2C):   34.2 ml 17.32 ml/m LA Vol (A4C):   42.2 ml 21.38  ml/m LA Biplane Vol: 40.0 ml 20.26 ml/m  AORTIC VALVE LVOT Vmax:  102.00 cm/s LVOT Vmean:  75.700 cm/s LVOT VTI:    0.191 m  AORTA Ao Root diam: 3.00 cm Ao Asc diam:  3.10 cm MITRAL VALVE               TRICUSPID VALVE MV Area (PHT): 3.08 cm    TR Peak grad:   33.9 mmHg MV Decel Time: 246 msec    TR Vmax:        291.00 cm/s MV E velocity: 60.60 cm/s MV A velocity: 73.70 cm/s  SHUNTS MV E/A ratio:  0.82        Systemic VTI:  0.19 m                            Systemic Diam: 2.10 cm Loralie Champagne MD Electronically signed by Loralie Champagne MD Signature Date/Time: 09/03/2020/4:05:10 PM    Final      TODAY-DAY OF DISCHARGE:  Subjective:   [name] today has no headache,no chest abdominal pain,no new weakness tingling or numbness, feels much better wants to go home today.   Objective:   Blood pressure (!) 119/55, pulse 68, temperature 97.7 F (36.5 C), temperature source Oral, resp. rate 20, height 5\' 3"  (1.6 m), weight 96 kg, SpO2 92 %.  Intake/Output Summary (Last 24 hours) at 09/06/2020 1148 Last data filed at 09/06/2020 0347 Gross per 24 hour  Intake 100 ml  Output 1500 ml  Net -1400 ml   Filed Weights   09/02/20 1836 09/06/20 0300  Weight: 95.3 kg 96 kg    Exam: Awake Alert, Oriented *3, No new F.N deficits, Normal affect [place].AT,PERRAL Supple Neck,No JVD, No cervical lymphadenopathy appriciated.  Symmetrical Chest wall movement, Good air movement bilaterally, CTAB RRR,No Gallops,Rubs or new Murmurs, No Parasternal Heave +ve B.Sounds, Abd Soft, Non tender, No organomegaly appriciated, No rebound -guarding or rigidity. No Cyanosis, Clubbing or edema, No new Rash or bruise   PERTINENT RADIOLOGIC STUDIES: DG Chest Port 1 View  Result Date: 09/02/2020 CLINICAL DATA:  Dyspnea.  Positive home COVID test. EXAM: PORTABLE CHEST 1 VIEW COMPARISON:  12/23/2013 FINDINGS: Interval patchy opacity in both lower lung zones. No pleural fluid. Enlarged cardiac silhouette with a mild increase in  size. Mild increase in prominence of the pulmonary vasculature. Unremarkable bones. IMPRESSION: 1. Mildly progressive cardiomegaly and pulmonary vascular congestion. 2. Bibasilar pneumonia.  Alveolar edema is less likely. Electronically Signed   By: Claudie Revering M.D.   On: 09/02/2020 19:18   ECHOCARDIOGRAM COMPLETE  Result Date: 09/03/2020    ECHOCARDIOGRAM REPORT   Patient Name:   [facility] Date of Exam: 09/03/2020 Medical Rec #:  [id number]            Height:       63.0 in Accession #:    [id number]           Weight:       210.0 lb Date of Birth:  30-Mar-1941            BSA:          1.974 m Patient Age:    32 years             BP:           129/71 mmHg Patient Gender: F                    HR:           112 bpm.  Exam Location:  Inpatient Procedure: 2D Echo, Cardiac Doppler, Color Doppler and Intracardiac            Opacification Agent Indications:    Atrial Fibrillation I48.91  History:        Patient has prior history of Echocardiogram examinations, most                 recent 12/24/2013. Risk Factors:Hypertension, Diabetes and                 Dyslipidemia. Pneumonia due to COVID-19 virus. [place]is[place].  Sonographer:    Darlina Sicilian RDCS Referring Phys: [id number] [place]  1. Left ventricular ejection fraction, by estimation, is 55 to 60%. The left ventricle has normal function. The left ventricle has no regional wall motion abnormalities. There is mild left ventricular hypertrophy. Left ventricular diastolic parameters are consistent with Grade I diastolic dysfunction (impaired relaxation).  2. Right ventricular systolic function is normal. The right ventricular size is mildly enlarged. There is mildly elevated pulmonary artery systolic pressure. The estimated right ventricular systolic pressure is 23.5 mmHg.  3. Left atrial size was mildly dilated.  4. The mitral valve is normal in structure. Trivial mitral valve regurgitation. No evidence of mitral stenosis.  5. The aortic valve is  tricuspid. Aortic valve regurgitation is not visualized. No aortic stenosis is present.  6. The inferior vena cava is normal in size with greater than 50% respiratory variability, suggesting right atrial pressure of 3 mmHg. FINDINGS  Left Ventricle: Left ventricular ejection fraction, by estimation, is 55 to 60%. The left ventricle has normal function. The left ventricle has no regional wall motion abnormalities. Definity contrast agent was given IV to delineate the left ventricular  endocardial borders. The left ventricular internal cavity size was normal in size. There is mild left ventricular hypertrophy. Left ventricular diastolic parameters are consistent with Grade I diastolic dysfunction (impaired relaxation). Right Ventricle: The right ventricular size is mildly enlarged. No increase in right ventricular wall thickness. Right ventricular systolic function is normal. There is mildly elevated pulmonary artery systolic pressure. The tricuspid regurgitant velocity is 2.91 m/s, and with an assumed right atrial pressure of 3 mmHg, the estimated right ventricular systolic pressure is 36.1 mmHg. Left Atrium: Left atrial size was mildly dilated. Right Atrium: Right atrial size was normal in size. Pericardium: There is no evidence of pericardial effusion. Mitral Valve: The mitral valve is normal in structure. Mild mitral annular calcification. Trivial mitral valve regurgitation. No evidence of mitral valve stenosis. Tricuspid Valve: The tricuspid valve is normal in structure. Tricuspid valve regurgitation is mild. Aortic Valve: The aortic valve is tricuspid. Aortic valve regurgitation is not visualized. No aortic stenosis is present. Pulmonic Valve: The pulmonic valve was normal in structure. Pulmonic valve regurgitation is not visualized. Aorta: The aortic root is normal in size and structure. Venous: The inferior vena cava is normal in size with greater than 50% respiratory variability, suggesting right atrial  pressure of 3 mmHg. IAS/Shunts: No atrial level shunt detected by color flow Doppler.  LEFT VENTRICLE PLAX 2D LVIDd:         4.00 cm     Diastology LVIDs:         2.60 cm     LV e' medial:    4.24 cm/s LV PW:         0.80 cm     LV E/e' medial:  14.3 LV IVS:        1.40 cm  LV e' lateral:   5.66 cm/s LVOT diam:     2.10 cm     LV E/e' lateral: 10.7 LV SV:         66 LV SV Index:   34 LVOT Area:     3.46 cm  LV Volumes (MOD) LV vol d, MOD A2C: 81.3 ml LV vol d, MOD A4C: 91.9 ml LV vol s, MOD A2C: 16.2 ml LV vol s, MOD A4C: 20.0 ml LV SV MOD A2C:     65.1 ml LV SV MOD A4C:     91.9 ml LV SV MOD BP:      68.7 ml RIGHT VENTRICLE RV S prime:     13.20 cm/s LEFT ATRIUM             Index LA diam:        3.20 cm 1.62 cm/m LA Vol (A2C):   34.2 ml 17.32 ml/m LA Vol (A4C):   42.2 ml 21.38 ml/m LA Biplane Vol: 40.0 ml 20.26 ml/m  AORTIC VALVE LVOT Vmax:   102.00 cm/s LVOT Vmean:  75.700 cm/s LVOT VTI:    0.191 m  AORTA Ao Root diam: 3.00 cm Ao Asc diam:  3.10 cm MITRAL VALVE               TRICUSPID VALVE MV Area (PHT): 3.08 cm    TR Peak grad:   33.9 mmHg MV Decel Time: 246 msec    TR Vmax:        291.00 cm/s MV E velocity: 60.60 cm/s MV A velocity: 73.70 cm/s  SHUNTS MV E/A ratio:  0.82        Systemic VTI:  0.19 m                            Systemic Diam: 2.10 cm Loralie Champagne MD Electronically signed by Loralie Champagne MD Signature Date/Time: 09/03/2020/4:05:10 PM    Final      PERTINENT LAB RESULTS: CBC: Recent Labs    09/05/20 0312 09/06/20 0043  WBC 10.1 21.0*  HGB 13.3 14.5  HCT 39.0 43.0  PLT 274 370   CMET CMP     Component Value Date/Time   NA 136 09/06/2020 0043   K 4.0 09/06/2020 0043   CL 94 (L) 09/06/2020 0043   CO2 31 09/06/2020 0043   GLUCOSE 92 09/06/2020 0043   BUN 25 (H) 09/06/2020 0043   CREATININE 0.67 09/06/2020 0043   CALCIUM 9.0 09/06/2020 0043   PROT 6.2 (L) 09/06/2020 0043   ALBUMIN 3.1 (L) 09/06/2020 0043   AST 18 09/06/2020 0043   ALT 13 09/06/2020 0043   ALKPHOS  49 09/06/2020 0043   BILITOT 0.7 09/06/2020 0043   GFRNONAA >60 09/06/2020 0043   GFRAA >90 12/25/2013 0630    GFR Estimated Creatinine Clearance: 63.9 mL/min (by C-G formula based on SCr of 0.67 mg/dL). No results for input(s): LIPASE, AMYLASE in the last 72 hours. No results for input(s): CKTOTAL, CKMB, CKMBINDEX, TROPONINI in the last 72 hours. Invalid input(s): POCBNP Recent Labs    09/05/20 0312 09/06/20 0043  DDIMER 0.28 0.37   No results for input(s): HGBA1C in the last 72 hours. No results for input(s): CHOL, HDL, LDLCALC, TRIG, CHOLHDL, LDLDIRECT in the last 72 hours. No results for input(s): TSH, T4TOTAL, T3FREE, THYROIDAB in the last 72 hours.  Invalid input(s): FREET3 No results for input(s): VITAMINB12, FOLATE, FERRITIN, TIBC, IRON, RETICCTPCT in the last 72  hours. Coags: No results for input(s): INR in the last 72 hours.  Invalid input(s): PT Microbiology: Recent Results (from the past 240 hour(s))  Resp Panel by RT-PCR (Flu A&B, Covid) Nasopharyngeal Swab     Status: Abnormal   Collection Time: 09/02/20  6:48 PM   Specimen: Nasopharyngeal Swab; Nasopharyngeal(NP) swabs in vial transport medium  Result Value Ref Range Status   SARS Coronavirus 2 by RT PCR POSITIVE (A) NEGATIVE Final    Comment: RESULT CALLED TO, READ BACK BY AND VERIFIED WITH: Katheran Awe RN [id number] 09/02/20 A BROWNING (NOTE) SARS-CoV-2 target nucleic acids are DETECTED.  The SARS-CoV-2 RNA is generally detectable in upper respiratory specimens during the acute phase of infection. Positive results are indicative of the presence of the identified virus, but do not rule out bacterial infection or co-infection with other pathogens not detected by the test. Clinical correlation with patient history and other diagnostic information is necessary to determine patient infection status. The expected result is Negative.  Fact Sheet for Patients: EntrepreneurPulse.com.au  Fact Sheet for  Healthcare Providers: IncredibleEmployment.be  This test is not yet approved or cleared by the [place] FDA and  has been authorized for detection and/or diagnosis of SARS-CoV-2 by FDA under an Emergency Use Authorization (EUA).  This EUA will remain in effect (meaning this test can  be used) for the duration of  the COVID-19 declaration under Section 564(b)(1) of the Act, 21 U.S.C. section 360bbb-3(b)(1), unless the authorization is terminated or revoked sooner.     Influenza A by PCR NEGATIVE NEGATIVE Final   Influenza B by PCR NEGATIVE NEGATIVE Final    Comment: (NOTE) The Xpert Xpress SARS-CoV-2/FLU/RSV plus assay is intended as an aid in the diagnosis of influenza from Nasopharyngeal swab specimens and should not be used as a sole basis for treatment. Nasal washings and aspirates are unacceptable for Xpert Xpress SARS-CoV-2/FLU/RSV testing.  Fact Sheet for Patients: EntrepreneurPulse.com.au  Fact Sheet for Healthcare Providers: IncredibleEmployment.be  This test is not yet approved or cleared by the [place] FDA and has been authorized for detection and/or diagnosis of SARS-CoV-2 by FDA under an Emergency Use Authorization (EUA). This EUA will remain in effect (meaning this test can be used) for the duration of the COVID-19 declaration under Section 564(b)(1) of the Act, 21 U.S.C. section 360bbb-3(b)(1), unless the authorization is terminated or revoked.  Performed at [facility], [place] [address], [place], [place] [address]   Blood Culture (routine x 2)     Status: None (Preliminary result)   Collection Time: 09/02/20  6:53 PM   Specimen: BLOOD  Result Value Ref Range Status   Specimen Description BLOOD SITE NOT SPECIFIED  Final   Special Requests   Final    BOTTLES DRAWN AEROBIC AND ANAEROBIC Blood Culture results may not be optimal due to an inadequate volume of blood received in culture bottles    Culture   Final    NO GROWTH 3 DAYS Performed at [facility], [place] [address], [place], [place] [address]    Report Status PENDING  Incomplete  Blood Culture (routine x 2)     Status: None (Preliminary result)   Collection Time: 09/02/20 11:25 PM   Specimen: BLOOD  Result Value Ref Range Status   Specimen Description BLOOD SITE NOT SPECIFIED  Final   Special Requests   Final    BOTTLES DRAWN AEROBIC AND ANAEROBIC Blood Culture results may not be optimal due to an inadequate volume of blood  received in culture bottles   Culture   Final    NO GROWTH 3 DAYS Performed at [facility], [place] [address] [place], [place] [address]    Report Status PENDING  Incomplete    FURTHER DISCHARGE INSTRUCTIONS:  Get Medicines reviewed and adjusted: Please take all your medications with you for your next visit with your Primary MD  Laboratory/radiological data: Please request your Primary MD to go over all hospital tests and procedure/radiological results at the follow up, please ask your Primary MD to get all Hospital records sent to his/her office.  In some cases, they will be blood work, cultures and biopsy results pending at the time of your discharge. Please request that your primary care M.D. goes through all the records of your hospital data and follows up on these results.  Also Note the following: If you experience worsening of your admission symptoms, develop shortness of breath, life threatening emergency, suicidal or homicidal thoughts you must seek medical attention immediately by calling 911 or calling your MD immediately  if symptoms less severe.  You must read complete instructions/literature along with all the possible adverse reactions/side effects for all the Medicines you take and that have been prescribed to you. Take any new Medicines after you have completely understood and accpet all the possible adverse reactions/side effects.   Do not drive when taking Pain  medications or sleeping medications (Benzodaizepines)  Do not take more than prescribed Pain, Sleep and Anxiety Medications. It is not advisable to combine anxiety,sleep and pain medications without talking with your primary care practitioner  Special Instructions: If you have smoked or chewed Tobacco  in the last 2 yrs please stop smoking, stop any regular Alcohol  and or any Recreational drug use.  Wear Seat belts while driving.  Please note: You were cared for by a hospitalist during your hospital stay. Once you are discharged, your primary care physician will handle any further medical issues. Please note that NO REFILLS for any discharge medications will be authorized once you are discharged, as it is imperative that you return to your primary care physician (or establish a relationship with a primary care physician if you do not have one) for your post hospital discharge needs so that they can reassess your need for medications and monitor your lab values.  Total Time spent coordinating discharge including counseling, education and face to face time equals 35 minutes.  SignedOren Binet 09/06/2020 11:48 AM

## 2020-09-06 NOTE — Care Management Important Message (Signed)
Important Message  Patient Details  Name: Carrie Moses MRN: [id number] Date of Birth: October 29, 1940   Medicare Important Message Given:  Yes - Important Message mailed due to current National Emergency   Verbal consent obtained due to current National Emergency  Relationship to patient: Self Contact Name: [facility] Call Date: 09/06/20  Time: 1430 Phone: [phone] Outcome: Spoke with contact Important Message mailed to: Patient address on file    [place] 09/06/2020, 2:30 PM

## 2020-09-06 NOTE — Discharge Instructions (Addendum)
Person Under Monitoring Name: Carrie Moses [facility]  Location: [place] [address]   Infection Prevention Recommendations for Individuals Confirmed to have, or Being Evaluated for, 2019 Novel Coronavirus (COVID-19) Infection Who Receive Care at Home  Individuals who are confirmed to have, or are being evaluated for, COVID-19 should follow the prevention steps below until a healthcare provider or local or state health department says they can return to normal activities.  Stay home except to get medical care You should restrict activities outside your home, except for getting medical care. Do not go to work, school, or public areas, and do not use public transportation or taxis.  Call ahead before visiting your doctor Before your medical appointment, call the healthcare provider and tell them that you have, or are being evaluated for, COVID-19 infection. This will help the healthcare provider's office take steps to keep other people from getting infected. Ask your healthcare provider to call the local or state health department.  Monitor your symptoms Seek prompt medical attention if your illness is worsening (e.g., difficulty breathing). Before going to your medical appointment, call the healthcare provider and tell them that you have, or are being evaluated for, COVID-19 infection. Ask your healthcare provider to call the local or state health department.  Wear a facemask You should wear a facemask that covers your nose and mouth when you are in the same room with other people and when you visit a healthcare provider. People who live with or visit you should also wear a facemask while they are in the same room with you.  Separate yourself from other people in your home As much as possible, you should stay in a different room from other people in your home. Also, you should use a separate bathroom, if available.  Avoid sharing household items You  should not share dishes, drinking glasses, cups, eating utensils, towels, bedding, or other items with other people in your home. After using these items, you should wash them thoroughly with soap and water.  Cover your coughs and sneezes Cover your mouth and nose with a tissue when you cough or sneeze, or you can cough or sneeze into your sleeve. Throw used tissues in a lined trash can, and immediately wash your hands with soap and water for at least 20 seconds or use an alcohol-based hand rub.  Wash your [facility] your hands often and thoroughly with soap and water for at least 20 seconds. You can use an alcohol-based hand sanitizer if soap and water are not available and if your hands are not visibly dirty. Avoid touching your eyes, nose, and mouth with unwashed hands.   Prevention Steps for Caregivers and Household Members of Individuals Confirmed to have, or Being Evaluated for, COVID-19 Infection Being Cared for in the Home  If you live with, or provide care at home for, a person confirmed to have, or being evaluated for, COVID-19 infection please follow these guidelines to prevent infection:  Follow healthcare provider's instructions Make sure that you understand and can help the patient follow any healthcare provider instructions for all care.  Provide for the patient's basic needs You should help the patient with basic needs in the home and provide support for getting groceries, prescriptions, and other personal needs.  Monitor the patient's symptoms If they are getting sicker, call his or her medical provider and tell them that the patient has, or is being evaluated for, COVID-19 infection. This  will help the healthcare provider's office take steps to keep other people from getting infected. Ask the healthcare provider to call the local or state health department.  Limit the number of people who have contact with the patient  If possible, have only one caregiver for the  patient.  Other household members should stay in another home or place of residence. If this is not possible, they should stay  in another room, or be separated from the patient as much as possible. Use a separate bathroom, if available.  Restrict visitors who do not have an essential need to be in the home.  Keep older adults, very young children, and other sick people away from the patient Keep older adults, very young children, and those who have compromised immune systems or chronic health conditions away from the patient. This includes people with chronic heart, lung, or kidney conditions, diabetes, and cancer.  Ensure good ventilation Make sure that shared spaces in the home have good air flow, such as from an air conditioner or an opened window, weather permitting.  Wash your hands often  Wash your hands often and thoroughly with soap and water for at least 20 seconds. You can use an alcohol based hand sanitizer if soap and water are not available and if your hands are not visibly dirty.  Avoid touching your eyes, nose, and mouth with unwashed hands.  Use disposable paper towels to dry your hands. If not available, use dedicated cloth towels and replace them when they become wet.  Wear a facemask and gloves  Wear a disposable facemask at all times in the room and gloves when you touch or have contact with the patient's blood, body fluids, and/or secretions or excretions, such as sweat, saliva, sputum, nasal mucus, vomit, urine, or feces.  Ensure the mask fits over your nose and mouth tightly, and do not touch it during use.  Throw out disposable facemasks and gloves after using them. Do not reuse.  Wash your hands immediately after removing your facemask and gloves.  If your personal clothing becomes contaminated, carefully remove clothing and launder. Wash your hands after handling contaminated clothing.  Place all used disposable facemasks, gloves, and other waste in a lined  container before disposing them with other household waste.  Remove gloves and wash your hands immediately after handling these items.  Do not share dishes, glasses, or other household items with the patient  Avoid sharing household items. You should not share dishes, drinking glasses, cups, eating utensils, towels, bedding, or other items with a patient who is confirmed to have, or being evaluated for, COVID-19 infection.  After the person uses these items, you should wash them thoroughly with soap and water.  Wash laundry thoroughly  Immediately remove and wash clothes or bedding that have blood, body fluids, and/or secretions or excretions, such as sweat, saliva, sputum, nasal mucus, vomit, urine, or feces, on them.  Wear gloves when handling laundry from the patient.  Read and follow directions on labels of laundry or clothing items and detergent. In general, wash and dry with the warmest temperatures recommended on the label.  Clean all areas the individual has used often  Clean all touchable surfaces, such as counters, tabletops, doorknobs, bathroom fixtures, toilets, phones, keyboards, tablets, and bedside tables, every day. Also, clean any surfaces that may have blood, body fluids, and/or secretions or excretions on them.  Wear gloves when cleaning surfaces the patient has come in contact with.  Use a diluted bleach solution (  e.g., dilute bleach with 1 part bleach and 10 parts water) or a household disinfectant with a label that says EPA-registered for coronaviruses. To make a bleach solution at home, add 1 tablespoon of bleach to 1 quart (4 cups) of water. For a larger supply, add  cup of bleach to 1 gallon (16 cups) of water.  Read labels of cleaning products and follow recommendations provided on product labels. Labels contain instructions for safe and effective use of the cleaning product including precautions you should take when applying the product, such as wearing gloves or  eye protection and making sure you have good ventilation during use of the product.  Remove gloves and wash hands immediately after cleaning.  Monitor yourself for signs and symptoms of illness Caregivers and household members are considered close contacts, should monitor their health, and will be asked to limit movement outside of the home to the extent possible. Follow the monitoring steps for close contacts listed on the symptom monitoring form.   ? If you have additional questions, contact your local health department or call the epidemiologist on call at [phone] (available 24/7). ? This guidance is subject to change. For the most up-to-date guidance from [facility], please refer to their website: [email or web address]-[email or web address]/ on my medicine - ELIQUIS (apixaban)  This medication education was reviewed with me or my healthcare representative as part of my discharge preparation.   Why was Eliquis prescribed for you? Eliquis was prescribed for you to reduce the risk of a blood clot forming that can cause a stroke if you have a medical condition called atrial fibrillation (a type of irregular heartbeat).  What do You need to know about Eliquis ? Take your Eliquis TWICE DAILY - one tablet in the morning and one tablet in the evening with or without food. If you have difficulty swallowing the tablet whole please discuss with your pharmacist how to take the medication safely.  Take Eliquis exactly as prescribed by your doctor and DO NOT stop taking Eliquis without talking to the doctor who prescribed the medication.  Stopping may increase your risk of developing a stroke.  Refill your prescription before you run out.  After discharge, you should have regular check-up appointments with your healthcare provider that is prescribing your Eliquis.  In the future your dose may need to be changed if your kidney function or weight changes by  a significant amount or as you get older.  What do you do if you miss a dose? If you miss a dose, take it as soon as you remember on the same day and resume taking twice daily.  Do not take more than one dose of ELIQUIS at the same time to make up a missed dose.  Important Safety Information A possible side effect of Eliquis is bleeding. You should call your healthcare provider right away if you experience any of the following: ? Bleeding from an injury or your nose that does not stop. ? Unusual colored urine (red or dark brown) or unusual colored stools (red or black). ? Unusual bruising for unknown reasons. ? A serious fall or if you hit your head (even if there is no bleeding).  Some medicines may interact with Eliquis and might increase your risk of bleeding or clotting while on Eliquis. To help avoid this, consult your healthcare provider or pharmacist prior to using any new prescription or non-prescription medications, including herbals, vitamins, non-steroidal anti-inflammatory drugs (NSAIDs) and supplements.  This website has more information on Eliquis (apixaban): http://www.eliquis.com/eliquis/home

## 2020-09-06 NOTE — Progress Notes (Signed)
Carrie Moses to be D/C'd Home per MD order.  Discussed with the patient and all questions fully answered.  VSS, Skin clean, dry and intact without evidence of skin break down, no evidence of skin tears noted. IV catheter discontinued intact. Site without signs and symptoms of complications. Dressing and pressure applied.  An After Visit Summary was printed and given to the patient. Patient received prescription mediation.  D/c education completed with patient/family including follow up instructions, medication list, d/c activities limitations if indicated, with other d/c instructions as indicated by MD - patient able to verbalize understanding, all questions fully answered.   Patient instructed to return to ED, call 911, or call MD for any changes in condition.   Patient escorted via [place], and D/C home via private auto.  Dene Gentry 09/06/2020 4:09 PM

## 2020-09-06 NOTE — TOC Benefit Eligibility Note (Signed)
Transition of Care [facility]) Benefit Eligibility Note    Patient Details  Name: Virdell Hoiland MRN: [id number] Date of Birth: 1940/10/23   Medication/Dose: Eliquis  Covered?: Yes  Tier: 3 Drug  Prescription Coverage Preferred Pharmacy: [facility], [facility]  Spoke with Person/Company/Phone Number:: [facility]  Co-Pay: $45.00  Prior Approval: No  Deductible: Met  Additional Notes: na    Delorse Lek Phone Number: 09/06/2020, 1:37 PM

## 2020-09-06 NOTE — Progress Notes (Signed)
Please see in addition to PT note:   SATURATION QUALIFICATIONS: (This note is used to comply with regulatory documentation for home oxygen)  Patient Saturations on Room Air at Rest = 91%   Patient Saturations on Room Air while Ambulating = 81%   Patient Saturations on 2 Liters of oxygen while Ambulating = 92%   Please briefly explain why patient needs home oxygen:rapid and sustained O2 desaturation with activity on room air   Windell Norfolk, DPT, PN1   Supplemental Physical Therapist [place]    Pager [phone] Acute Rehab Office [phone]

## 2020-09-06 NOTE — Progress Notes (Signed)
Occupational Therapy Treatment Patient Details Name: Carrie Moses MRN: [id number] DOB: [birth year] Today's Date: 09/06/2020    History of present illness 80yo female admitted 09/02/20 with c/o SOB, violent cough, and positive home Covid test. Admitted with sepsis secondary to pneumonia, A-fib. PMH DM, HLD, HTN   OT comments  OT treatment session with focus on self-care re-education with use of AE. After demonstration and instruction, patient able to return demo use of reacher to don underwear up to thighs and use of sock-aid to don footwear. Patient education on safety with ADL transfers including getting in and out of the tub/shower with use of tub bench. Patient states that her daughter is able to provide supervision and limited physical assistance. OT will continue to follow acutely to maximize safety and independence with self-care tasks an decrease caregiver burden in prep for safe return home.    Follow Up Recommendations  Home health OT;Supervision/Assistance - 24 hour    Equipment Recommendations  None recommended by OT    Recommendations for Other Services      Precautions / Restrictions Precautions Precautions: Fall Precaution Comments: bring brief, Covid +, watch sats Restrictions Weight Bearing Restrictions: No       Mobility Bed Mobility Overal bed mobility: Needs Assistance Bed Mobility: Supine to Sit     Supine to sit: Min guard;HOB elevated     General bed mobility comments: Patient seated in recliner upon entry.    Transfers Overall transfer level: Needs assistance Equipment used: Rolling walker (2 wheeled) Transfers: Sit to/from Stand Sit to Stand: Min guard         General transfer comment: min guard to physically boost to upright, but Mod VC for hand placement and sequencing cues/use of device and general safety    Balance Overall balance assessment: Needs assistance Sitting-balance support: Feet supported Sitting balance-Leahy Scale:  Good     Standing balance support: Bilateral upper extremity supported;During functional activity Standing balance-Leahy Scale: Fair Standing balance comment: reliant on walker, but can static stand without AD                           ADL either performed or assessed with clinical judgement   ADL Overall ADL's : Needs assistance/impaired                     Lower Body Dressing: Minimal assistance;Sit to/from stand Lower Body Dressing Details (indicate cue type and reason): Education on AE to increase independence with ADLs. Patient able to return demo use of reacher and sock-aid to don LB clothing.               General ADL Comments: Continues to be limited by decreased activity tolerance, decreased cardiopulmonary edurance, and need for supplemental O2.     Vision       Perception     Praxis      Cognition Arousal/Alertness: Awake/alert Behavior During Therapy: WFL for tasks assessed/performed Overall Cognitive Status: Impaired/Different from baseline Area of Impairment: Attention;Memory;Following commands;Safety/judgement;Awareness;Problem solving                   Current Attention Level: Selective Memory: Decreased short-term memory Following Commands: Follows one step commands consistently;Follows one step commands with increased time Safety/Judgement: Decreased awareness of safety;Decreased awareness of deficits Awareness: Emergent Problem Solving: Difficulty sequencing;Requires verbal cues General Comments: cues for safety and sequencing, continues to have STM deficits and asks repeated questions during session, unsure how  close this is to baseline cognition        Exercises     Shoulder Instructions       General Comments SpO2 92-94% on 2L, HR 104bpm, and BP 121/25. Vitals remained consistent throughout treatment session with focus on AE education.    Pertinent Vitals/ Pain       Pain Assessment: No/denies pain  Home  Living                                          Prior Functioning/Environment              Frequency  Min 2X/week        Progress Toward Goals  OT Goals(current goals can now be found in the care plan section)  Progress towards OT goals: Progressing toward goals  Acute Rehab OT Goals Patient Stated Goal: I'd like to go back home. OT Goal Formulation: With patient Time For Goal Achievement: 09/18/20 Potential to Achieve Goals: Good ADL Goals Pt Will Perform Lower Body Bathing: with modified independence;sit to/from stand;with adaptive equipment Pt Will Perform Lower Body Dressing: with modified independence;sit to/from stand;with adaptive equipment  Plan Discharge plan remains appropriate;Frequency remains appropriate    Co-evaluation                 AM-PAC OT "6 Clicks" Daily Activity     Outcome Measure   Help from another person eating meals?: None Help from another person taking care of personal grooming?: None Help from another person toileting, which includes using toliet, bedpan, or urinal?: A Little Help from another person bathing (including washing, rinsing, drying)?: A Lot Help from another person to put on and taking off regular upper body clothing?: A Little Help from another person to put on and taking off regular lower body clothing?: A Lot 6 Click Score: 18    End of Session Equipment Utilized During Treatment: Oxygen (2L via [place])  OT Visit Diagnosis: Unsteadiness on feet (R26.81)   Activity Tolerance Patient limited by fatigue   Patient Left in chair;with call bell/phone within reach   Nurse Communication          Time: 0272-5366 OT Time Calculation (min): 23 min  Charges: OT General Charges $OT Visit: 1 Visit OT Treatments $Self Care/Home Management : 23-37 mins  Galileo Colello H. OTR/L Supplemental OT, Department of rehab services [phone]   Elizjah Noblet R H. 09/06/2020, 1:10 PM

## 2020-09-06 NOTE — TOC Progression Note (Addendum)
Transition of Care [facility]) - Progression Note    Patient Details  Name: Carrie Moses MRN: [id number] Date of Birth: 07-26-1940  Transition of Care [facility]) CM/SW Contact  Loletha Grayer Beverely Pace, RN Phone Number: 09/06/2020, 10:23 AM  Clinical Narrative:   Case Manager spoke with Patient concerning discharge needs. Discussed choice for Home Health agencies. Referral was called to Adela Lank, liaison for [facility]. Patient states she has a rolling walker, lives with her daughter. CM has submitted benefits request for co pay for Eliquis. Will update patient with cost when received.  12:42pm. Patient has been ordered oxygen for home, due to desating, oxygen was ordered through [employer]. Patient declined delivery.   Expected Discharge Plan: [place] Barriers to Discharge: No Barriers Identified  Expected Discharge Plan and Services Expected Discharge Plan: [place]   Discharge Planning Services: CM Consult Post Acute Care Choice: [place] arrangements for the past 2 months: Single Family Home Expected Discharge Date: 09/06/20               DME Arranged: N/A DME Agency: NA       HH Arranged: PT,OT,Nurse's Aide HH Agency: [place] Date [facility] Agency Contacted: 09/06/20 Time [place] Agency Contacted: [phone] Representative spoke with at [place]: Adela Lank   Social Determinants of Health (SDOH) Interventions    Readmission Risk Interventions No flowsheet data found.

## 2020-09-06 NOTE — Progress Notes (Signed)
Physical Therapy Treatment Patient Details Name: Carrie Moses MRN: [id number] DOB: 1941-02-14 Today's Date: 09/06/2020    History of Present Illness 80yo female admitted 09/02/20 with c/o SOB, violent cough, and positive home Covid test. Admitted with sepsis secondary to pneumonia, A-fib. PMH DM, HLD, HTN    PT Comments    Patient received in bed, pleasant and cooperative but still with STM deficits and needing as much as Mod cues for sequencing/safety as well as increased processing time. Soaked in urine in bed with little awareness. Able to mobilize on a min guard basis but does need up to Mod VC for safety/sequencing with transfers and gait. Feel confident that she desats into low 80s on RA despite poor pleth- see sat note for details. Left up in recliner with all needs met, RN aware of patient status.     Follow Up Recommendations  Home health PT;Supervision for mobility/OOB     Equipment Recommendations  None recommended by PT;Other (comment) (has RW)    Recommendations for Other Services       Precautions / Restrictions Precautions Precautions: Fall Precaution Comments: bring brief, Covid +, watch sats Restrictions Weight Bearing Restrictions: No    Mobility  Bed Mobility Overal bed mobility: Needs Assistance Bed Mobility: Supine to Sit     Supine to sit: Min guard;HOB elevated     General bed mobility comments: increased time and effort, HOB moderately elevated    Transfers Overall transfer level: Needs assistance Equipment used: Rolling walker (2 wheeled) Transfers: Sit to/from Stand Sit to Stand: Min guard         General transfer comment: min guard to physically boost to upright, but Mod VC for hand placement and sequencing cues/use of device and general safety  Ambulation/Gait Ambulation/Gait assistance: Min guard Gait Distance (Feet): 10 Feet Assistive device: Rolling walker (2 wheeled) Gait Pattern/deviations: Step-through pattern;Trunk  flexed;Drifts right/left     General Gait Details: min guard but with Min VC for safety with device/maintaining proximity to RW. HR to 120BPM, SPO2 with poor signal but feel it did reliabily drop to low 80s on RA   Stairs             Wheelchair Mobility    Modified Rankin (Stroke Patients Only)       Balance Overall balance assessment: Needs assistance Sitting-balance support: Feet supported Sitting balance-Leahy Scale: Good     Standing balance support: Bilateral upper extremity supported;During functional activity Standing balance-Leahy Scale: Fair Standing balance comment: reliant on walker, but can static stand without AD                            Cognition Arousal/Alertness: Awake/alert Behavior During Therapy: WFL for tasks assessed/performed Overall Cognitive Status: Within Functional Limits for tasks assessed Area of Impairment: Attention;Memory;Following commands;Safety/judgement;Awareness;Problem solving                   Current Attention Level: Selective Memory: Decreased short-term memory Following Commands: Follows one step commands consistently;Follows one step commands with increased time Safety/Judgement: Decreased awareness of safety;Decreased awareness of deficits Awareness: Emergent Problem Solving: Difficulty sequencing;Requires verbal cues General Comments: cues for safety and sequencing, continues to have STM deficits and asks repeated questions during session, unsure how close this is to baseline cognition      Exercises      General Comments General comments (skin integrity, edema, etc.): HR to 120BPM with activity, see sat note for details. Poor pleth  but very likely desat into 80s on RA.      Pertinent Vitals/Pain Pain Assessment: No/denies pain    Home Living                      Prior Function            PT Goals (current goals can now be found in the care plan section) Acute Rehab PT  Goals Patient Stated Goal: I'd like to go back home. PT Goal Formulation: With patient Time For Goal Achievement: 09/17/20 Potential to Achieve Goals: Good Progress towards PT goals: Progressing toward goals    Frequency    Min 3X/week      PT Plan Current plan remains appropriate    Co-evaluation              AM-PAC PT "6 Clicks" Mobility   Outcome Measure  Help needed turning from your back to your side while in a flat bed without using bedrails?: A Little Help needed moving from lying on your back to sitting on the side of a flat bed without using bedrails?: A Little Help needed moving to and from a bed to a chair (including a wheelchair)?: A Little Help needed standing up from a chair using your arms (e.g., wheelchair or bedside chair)?: A Little Help needed to walk in hospital room?: A Little Help needed climbing 3-5 steps with a railing? : A Lot 6 Click Score: 17    End of Session   Activity Tolerance: Patient tolerated treatment well Patient left: in chair;with call bell/phone within reach Nurse Communication: Mobility status PT Visit Diagnosis: Unsteadiness on feet (R26.81);Muscle weakness (generalized) (M62.81);Difficulty in walking, not elsewhere classified (R26.2);History of falling (Z91.81)     Time: 8416-6063 PT Time Calculation (min) (ACUTE ONLY): 25 min  Charges:  $Gait Training: 8-22 mins $Therapeutic Activity: 8-22 mins                     Windell Norfolk, DPT, PN1   Supplemental Physical Therapist [place]    Pager [phone] Acute Rehab Office [phone]

## 2020-09-07 ENCOUNTER — Other Ambulatory Visit (HOSPITAL_COMMUNITY): Payer: Self-pay

## 2020-09-07 LAB — CULTURE, BLOOD (ROUTINE X 2)
Culture: NO GROWTH
Culture: NO GROWTH

## 2020-09-08 ENCOUNTER — Telehealth (HOSPITAL_COMMUNITY): Payer: Self-pay

## 2020-09-08 DIAGNOSIS — E1165 Type 2 diabetes mellitus with hyperglycemia: Secondary | ICD-10-CM | POA: Diagnosis not present

## 2020-09-08 DIAGNOSIS — T380X5D Adverse effect of glucocorticoids and synthetic analogues, subsequent encounter: Secondary | ICD-10-CM | POA: Diagnosis not present

## 2020-09-08 DIAGNOSIS — J9601 Acute respiratory failure with hypoxia: Secondary | ICD-10-CM | POA: Diagnosis not present

## 2020-09-08 DIAGNOSIS — I10 Essential (primary) hypertension: Secondary | ICD-10-CM | POA: Diagnosis not present

## 2020-09-08 DIAGNOSIS — U071 COVID-19: Secondary | ICD-10-CM | POA: Diagnosis not present

## 2020-09-08 DIAGNOSIS — A419 Sepsis, unspecified organism: Secondary | ICD-10-CM | POA: Diagnosis not present

## 2020-09-08 DIAGNOSIS — I480 Paroxysmal atrial fibrillation: Secondary | ICD-10-CM | POA: Diagnosis not present

## 2020-09-08 DIAGNOSIS — E785 Hyperlipidemia, unspecified: Secondary | ICD-10-CM | POA: Diagnosis not present

## 2020-09-08 DIAGNOSIS — J1282 Pneumonia due to coronavirus disease 2019: Secondary | ICD-10-CM | POA: Diagnosis not present

## 2020-09-08 NOTE — Telephone Encounter (Signed)
Pharmacy Transitions of Care Follow-up Telephone Call  Date of discharge: 09/06/20 Discharge Diagnosis: viral covid pneumonia with afib requiring eliquis  How have you been since you were released from the hospital?  Patient doing well. Has been tired but other than that doing well.  Medication changes made at discharge: START taking:  albuterol (VENTOLIN HFA)   benzonatate (Tessalon Perles)   Eliquis 5  metoprolol tartrate (LOPRESSOR)   predniSONE (DELTASONE)   STOP taking:  clopidogrel 75 MG tablet (PLAVIX)   insulin aspart 100 UNIT/ML injection (NovoLOG)   insulin detemir 100 UNIT/ML injection (LEVEMIR)    Medication changes verified by the patient?  Yes    Medication Accessibility:  Home Pharmacy: [facility] mail order  Was the patient provided with refills on discharged medications? no  Have all prescriptions been transferred from [facility] to home pharmacy? N/a  Is the patient able to afford medications? No cannot afford refills on Eliquis. Copay is 547. Will discuss options at next visit. . Eligible patient assistance: part D, possible MAP if necessary    Medication Review:   APIXABAN (ELIQUIS)  Apixaban 5 mg BID initiated on 09/03/20.  - Discussed importance of taking medication around the same time everyday  - Reviewed potential DDIs with patient  - Advised patient of medications to avoid (NSAIDs, ASA)  - Educated that Tylenol (acetaminophen) will be the preferred analgesic to prevent risk of bleeding  - Emphasized importance of monitoring for signs and symptoms of bleeding (abnormal bruising, prolonged bleeding, nose bleeds, bleeding from gums, discolored urine, black tarry stools)  - Advised patient to alert all providers of anticoagulation therapy prior to starting a new medication or having a procedure    Follow-up Appointments:  Follow up with PCP next Tuesday 09/14/20, will discuss duration of medication at this visit.  [facility] f/u appt confirmed?  Scheduled to see Laurann Montana on Th 6/23 @ 2:45pm.   If their condition worsens, is the pt aware to call PCP or go to the Emergency Dept.? yes  Final Patient Assessment: Patient doing well. Has follow up scheduled. Will discuss refills at this visit.

## 2020-09-10 DIAGNOSIS — J1282 Pneumonia due to coronavirus disease 2019: Secondary | ICD-10-CM | POA: Diagnosis not present

## 2020-09-10 DIAGNOSIS — T380X5D Adverse effect of glucocorticoids and synthetic analogues, subsequent encounter: Secondary | ICD-10-CM | POA: Diagnosis not present

## 2020-09-10 DIAGNOSIS — J9601 Acute respiratory failure with hypoxia: Secondary | ICD-10-CM | POA: Diagnosis not present

## 2020-09-10 DIAGNOSIS — E785 Hyperlipidemia, unspecified: Secondary | ICD-10-CM | POA: Diagnosis not present

## 2020-09-10 DIAGNOSIS — I10 Essential (primary) hypertension: Secondary | ICD-10-CM | POA: Diagnosis not present

## 2020-09-10 DIAGNOSIS — E1165 Type 2 diabetes mellitus with hyperglycemia: Secondary | ICD-10-CM | POA: Diagnosis not present

## 2020-09-10 DIAGNOSIS — A419 Sepsis, unspecified organism: Secondary | ICD-10-CM | POA: Diagnosis not present

## 2020-09-10 DIAGNOSIS — I480 Paroxysmal atrial fibrillation: Secondary | ICD-10-CM | POA: Diagnosis not present

## 2020-09-10 DIAGNOSIS — U071 COVID-19: Secondary | ICD-10-CM | POA: Diagnosis not present

## 2020-09-14 DIAGNOSIS — A419 Sepsis, unspecified organism: Secondary | ICD-10-CM | POA: Diagnosis not present

## 2020-09-14 DIAGNOSIS — I480 Paroxysmal atrial fibrillation: Secondary | ICD-10-CM | POA: Diagnosis not present

## 2020-09-14 DIAGNOSIS — T380X5D Adverse effect of glucocorticoids and synthetic analogues, subsequent encounter: Secondary | ICD-10-CM | POA: Diagnosis not present

## 2020-09-14 DIAGNOSIS — I10 Essential (primary) hypertension: Secondary | ICD-10-CM | POA: Diagnosis not present

## 2020-09-14 DIAGNOSIS — E785 Hyperlipidemia, unspecified: Secondary | ICD-10-CM | POA: Diagnosis not present

## 2020-09-14 DIAGNOSIS — U071 COVID-19: Secondary | ICD-10-CM | POA: Diagnosis not present

## 2020-09-14 DIAGNOSIS — J1282 Pneumonia due to coronavirus disease 2019: Secondary | ICD-10-CM | POA: Diagnosis not present

## 2020-09-14 DIAGNOSIS — J9601 Acute respiratory failure with hypoxia: Secondary | ICD-10-CM | POA: Diagnosis not present

## 2020-09-14 DIAGNOSIS — E1165 Type 2 diabetes mellitus with hyperglycemia: Secondary | ICD-10-CM | POA: Diagnosis not present

## 2020-09-17 DIAGNOSIS — J9601 Acute respiratory failure with hypoxia: Secondary | ICD-10-CM | POA: Diagnosis not present

## 2020-09-17 DIAGNOSIS — I10 Essential (primary) hypertension: Secondary | ICD-10-CM | POA: Diagnosis not present

## 2020-09-17 DIAGNOSIS — T380X5D Adverse effect of glucocorticoids and synthetic analogues, subsequent encounter: Secondary | ICD-10-CM | POA: Diagnosis not present

## 2020-09-17 DIAGNOSIS — U071 COVID-19: Secondary | ICD-10-CM | POA: Diagnosis not present

## 2020-09-17 DIAGNOSIS — A419 Sepsis, unspecified organism: Secondary | ICD-10-CM | POA: Diagnosis not present

## 2020-09-17 DIAGNOSIS — E785 Hyperlipidemia, unspecified: Secondary | ICD-10-CM | POA: Diagnosis not present

## 2020-09-17 DIAGNOSIS — J1282 Pneumonia due to coronavirus disease 2019: Secondary | ICD-10-CM | POA: Diagnosis not present

## 2020-09-17 DIAGNOSIS — E1165 Type 2 diabetes mellitus with hyperglycemia: Secondary | ICD-10-CM | POA: Diagnosis not present

## 2020-09-17 DIAGNOSIS — I480 Paroxysmal atrial fibrillation: Secondary | ICD-10-CM | POA: Diagnosis not present

## 2020-09-21 DIAGNOSIS — D72829 Elevated white blood cell count, unspecified: Secondary | ICD-10-CM | POA: Diagnosis not present

## 2020-09-21 DIAGNOSIS — U071 COVID-19: Secondary | ICD-10-CM | POA: Diagnosis not present

## 2020-09-21 DIAGNOSIS — E8809 Other disorders of plasma-protein metabolism, not elsewhere classified: Secondary | ICD-10-CM | POA: Diagnosis not present

## 2020-09-21 DIAGNOSIS — Z6841 Body Mass Index (BMI) 40.0 and over, adult: Secondary | ICD-10-CM | POA: Diagnosis not present

## 2020-09-21 DIAGNOSIS — J1282 Pneumonia due to coronavirus disease 2019: Secondary | ICD-10-CM | POA: Diagnosis not present

## 2020-09-21 DIAGNOSIS — Z7689 Persons encountering health services in other specified circumstances: Secondary | ICD-10-CM | POA: Diagnosis not present

## 2020-09-21 DIAGNOSIS — I4891 Unspecified atrial fibrillation: Secondary | ICD-10-CM | POA: Diagnosis not present

## 2020-09-21 DIAGNOSIS — Z79899 Other long term (current) drug therapy: Secondary | ICD-10-CM | POA: Diagnosis not present

## 2020-09-23 DIAGNOSIS — E1165 Type 2 diabetes mellitus with hyperglycemia: Secondary | ICD-10-CM | POA: Diagnosis not present

## 2020-09-23 DIAGNOSIS — J1282 Pneumonia due to coronavirus disease 2019: Secondary | ICD-10-CM | POA: Diagnosis not present

## 2020-09-23 DIAGNOSIS — J9601 Acute respiratory failure with hypoxia: Secondary | ICD-10-CM | POA: Diagnosis not present

## 2020-09-23 DIAGNOSIS — T380X5D Adverse effect of glucocorticoids and synthetic analogues, subsequent encounter: Secondary | ICD-10-CM | POA: Diagnosis not present

## 2020-09-23 DIAGNOSIS — I10 Essential (primary) hypertension: Secondary | ICD-10-CM | POA: Diagnosis not present

## 2020-09-23 DIAGNOSIS — I480 Paroxysmal atrial fibrillation: Secondary | ICD-10-CM | POA: Diagnosis not present

## 2020-09-23 DIAGNOSIS — A419 Sepsis, unspecified organism: Secondary | ICD-10-CM | POA: Diagnosis not present

## 2020-09-23 DIAGNOSIS — U071 COVID-19: Secondary | ICD-10-CM | POA: Diagnosis not present

## 2020-09-23 DIAGNOSIS — E785 Hyperlipidemia, unspecified: Secondary | ICD-10-CM | POA: Diagnosis not present

## 2020-09-28 DIAGNOSIS — T380X5D Adverse effect of glucocorticoids and synthetic analogues, subsequent encounter: Secondary | ICD-10-CM | POA: Diagnosis not present

## 2020-09-28 DIAGNOSIS — E785 Hyperlipidemia, unspecified: Secondary | ICD-10-CM | POA: Diagnosis not present

## 2020-09-28 DIAGNOSIS — J1282 Pneumonia due to coronavirus disease 2019: Secondary | ICD-10-CM | POA: Diagnosis not present

## 2020-09-28 DIAGNOSIS — J9601 Acute respiratory failure with hypoxia: Secondary | ICD-10-CM | POA: Diagnosis not present

## 2020-09-28 DIAGNOSIS — E1165 Type 2 diabetes mellitus with hyperglycemia: Secondary | ICD-10-CM | POA: Diagnosis not present

## 2020-09-28 DIAGNOSIS — I10 Essential (primary) hypertension: Secondary | ICD-10-CM | POA: Diagnosis not present

## 2020-09-28 DIAGNOSIS — I480 Paroxysmal atrial fibrillation: Secondary | ICD-10-CM | POA: Diagnosis not present

## 2020-09-28 DIAGNOSIS — U071 COVID-19: Secondary | ICD-10-CM | POA: Diagnosis not present

## 2020-09-28 DIAGNOSIS — A419 Sepsis, unspecified organism: Secondary | ICD-10-CM | POA: Diagnosis not present

## 2020-09-29 DIAGNOSIS — E1165 Type 2 diabetes mellitus with hyperglycemia: Secondary | ICD-10-CM | POA: Diagnosis not present

## 2020-09-29 DIAGNOSIS — A419 Sepsis, unspecified organism: Secondary | ICD-10-CM | POA: Diagnosis not present

## 2020-09-29 DIAGNOSIS — E785 Hyperlipidemia, unspecified: Secondary | ICD-10-CM | POA: Diagnosis not present

## 2020-09-29 DIAGNOSIS — I480 Paroxysmal atrial fibrillation: Secondary | ICD-10-CM | POA: Diagnosis not present

## 2020-09-29 DIAGNOSIS — J1282 Pneumonia due to coronavirus disease 2019: Secondary | ICD-10-CM | POA: Diagnosis not present

## 2020-09-29 DIAGNOSIS — J9601 Acute respiratory failure with hypoxia: Secondary | ICD-10-CM | POA: Diagnosis not present

## 2020-09-29 DIAGNOSIS — U071 COVID-19: Secondary | ICD-10-CM | POA: Diagnosis not present

## 2020-09-29 DIAGNOSIS — I10 Essential (primary) hypertension: Secondary | ICD-10-CM | POA: Diagnosis not present

## 2020-09-29 DIAGNOSIS — T380X5D Adverse effect of glucocorticoids and synthetic analogues, subsequent encounter: Secondary | ICD-10-CM | POA: Diagnosis not present

## 2020-10-06 DIAGNOSIS — E1165 Type 2 diabetes mellitus with hyperglycemia: Secondary | ICD-10-CM | POA: Diagnosis not present

## 2020-10-06 DIAGNOSIS — T380X5D Adverse effect of glucocorticoids and synthetic analogues, subsequent encounter: Secondary | ICD-10-CM | POA: Diagnosis not present

## 2020-10-06 DIAGNOSIS — I480 Paroxysmal atrial fibrillation: Secondary | ICD-10-CM | POA: Diagnosis not present

## 2020-10-06 DIAGNOSIS — J9601 Acute respiratory failure with hypoxia: Secondary | ICD-10-CM | POA: Diagnosis not present

## 2020-10-06 DIAGNOSIS — E785 Hyperlipidemia, unspecified: Secondary | ICD-10-CM | POA: Diagnosis not present

## 2020-10-06 DIAGNOSIS — J1282 Pneumonia due to coronavirus disease 2019: Secondary | ICD-10-CM | POA: Diagnosis not present

## 2020-10-06 DIAGNOSIS — U071 COVID-19: Secondary | ICD-10-CM | POA: Diagnosis not present

## 2020-10-06 DIAGNOSIS — I10 Essential (primary) hypertension: Secondary | ICD-10-CM | POA: Diagnosis not present

## 2020-10-06 DIAGNOSIS — A419 Sepsis, unspecified organism: Secondary | ICD-10-CM | POA: Diagnosis not present

## 2020-10-07 DIAGNOSIS — A419 Sepsis, unspecified organism: Secondary | ICD-10-CM | POA: Diagnosis not present

## 2020-10-07 DIAGNOSIS — E1165 Type 2 diabetes mellitus with hyperglycemia: Secondary | ICD-10-CM | POA: Diagnosis not present

## 2020-10-07 DIAGNOSIS — J9601 Acute respiratory failure with hypoxia: Secondary | ICD-10-CM | POA: Diagnosis not present

## 2020-10-07 DIAGNOSIS — E785 Hyperlipidemia, unspecified: Secondary | ICD-10-CM | POA: Diagnosis not present

## 2020-10-07 DIAGNOSIS — T380X5D Adverse effect of glucocorticoids and synthetic analogues, subsequent encounter: Secondary | ICD-10-CM | POA: Diagnosis not present

## 2020-10-07 DIAGNOSIS — J1282 Pneumonia due to coronavirus disease 2019: Secondary | ICD-10-CM | POA: Diagnosis not present

## 2020-10-07 DIAGNOSIS — U071 COVID-19: Secondary | ICD-10-CM | POA: Diagnosis not present

## 2020-10-07 DIAGNOSIS — I10 Essential (primary) hypertension: Secondary | ICD-10-CM | POA: Diagnosis not present

## 2020-10-07 DIAGNOSIS — I480 Paroxysmal atrial fibrillation: Secondary | ICD-10-CM | POA: Diagnosis not present

## 2020-10-11 DIAGNOSIS — A419 Sepsis, unspecified organism: Secondary | ICD-10-CM | POA: Diagnosis not present

## 2020-10-11 DIAGNOSIS — I10 Essential (primary) hypertension: Secondary | ICD-10-CM | POA: Diagnosis not present

## 2020-10-11 DIAGNOSIS — U071 COVID-19: Secondary | ICD-10-CM | POA: Diagnosis not present

## 2020-10-11 DIAGNOSIS — J1282 Pneumonia due to coronavirus disease 2019: Secondary | ICD-10-CM | POA: Diagnosis not present

## 2020-10-11 DIAGNOSIS — E785 Hyperlipidemia, unspecified: Secondary | ICD-10-CM | POA: Diagnosis not present

## 2020-10-11 DIAGNOSIS — J9601 Acute respiratory failure with hypoxia: Secondary | ICD-10-CM | POA: Diagnosis not present

## 2020-10-11 DIAGNOSIS — T380X5D Adverse effect of glucocorticoids and synthetic analogues, subsequent encounter: Secondary | ICD-10-CM | POA: Diagnosis not present

## 2020-10-11 DIAGNOSIS — I480 Paroxysmal atrial fibrillation: Secondary | ICD-10-CM | POA: Diagnosis not present

## 2020-10-11 DIAGNOSIS — E1165 Type 2 diabetes mellitus with hyperglycemia: Secondary | ICD-10-CM | POA: Diagnosis not present

## 2020-10-13 DIAGNOSIS — I480 Paroxysmal atrial fibrillation: Secondary | ICD-10-CM | POA: Diagnosis not present

## 2020-10-13 DIAGNOSIS — T380X5D Adverse effect of glucocorticoids and synthetic analogues, subsequent encounter: Secondary | ICD-10-CM | POA: Diagnosis not present

## 2020-10-13 DIAGNOSIS — A419 Sepsis, unspecified organism: Secondary | ICD-10-CM | POA: Diagnosis not present

## 2020-10-13 DIAGNOSIS — E785 Hyperlipidemia, unspecified: Secondary | ICD-10-CM | POA: Diagnosis not present

## 2020-10-13 DIAGNOSIS — I10 Essential (primary) hypertension: Secondary | ICD-10-CM | POA: Diagnosis not present

## 2020-10-13 DIAGNOSIS — J1282 Pneumonia due to coronavirus disease 2019: Secondary | ICD-10-CM | POA: Diagnosis not present

## 2020-10-13 DIAGNOSIS — U071 COVID-19: Secondary | ICD-10-CM | POA: Diagnosis not present

## 2020-10-13 DIAGNOSIS — E1165 Type 2 diabetes mellitus with hyperglycemia: Secondary | ICD-10-CM | POA: Diagnosis not present

## 2020-10-13 DIAGNOSIS — J9601 Acute respiratory failure with hypoxia: Secondary | ICD-10-CM | POA: Diagnosis not present

## 2020-10-14 ENCOUNTER — Ambulatory Visit: Payer: Medicare HMO | Admitting: Family

## 2020-10-19 DIAGNOSIS — A419 Sepsis, unspecified organism: Secondary | ICD-10-CM | POA: Diagnosis not present

## 2020-10-19 DIAGNOSIS — J9601 Acute respiratory failure with hypoxia: Secondary | ICD-10-CM | POA: Diagnosis not present

## 2020-10-19 DIAGNOSIS — T380X5D Adverse effect of glucocorticoids and synthetic analogues, subsequent encounter: Secondary | ICD-10-CM | POA: Diagnosis not present

## 2020-10-19 DIAGNOSIS — E1165 Type 2 diabetes mellitus with hyperglycemia: Secondary | ICD-10-CM | POA: Diagnosis not present

## 2020-10-19 DIAGNOSIS — U071 COVID-19: Secondary | ICD-10-CM | POA: Diagnosis not present

## 2020-10-19 DIAGNOSIS — J1282 Pneumonia due to coronavirus disease 2019: Secondary | ICD-10-CM | POA: Diagnosis not present

## 2020-10-19 DIAGNOSIS — I480 Paroxysmal atrial fibrillation: Secondary | ICD-10-CM | POA: Diagnosis not present

## 2020-10-19 DIAGNOSIS — E785 Hyperlipidemia, unspecified: Secondary | ICD-10-CM | POA: Diagnosis not present

## 2020-10-19 DIAGNOSIS — I10 Essential (primary) hypertension: Secondary | ICD-10-CM | POA: Diagnosis not present

## 2020-10-20 DIAGNOSIS — I480 Paroxysmal atrial fibrillation: Secondary | ICD-10-CM | POA: Diagnosis not present

## 2020-10-20 DIAGNOSIS — E785 Hyperlipidemia, unspecified: Secondary | ICD-10-CM | POA: Diagnosis not present

## 2020-10-20 DIAGNOSIS — J1282 Pneumonia due to coronavirus disease 2019: Secondary | ICD-10-CM | POA: Diagnosis not present

## 2020-10-20 DIAGNOSIS — E1165 Type 2 diabetes mellitus with hyperglycemia: Secondary | ICD-10-CM | POA: Diagnosis not present

## 2020-10-20 DIAGNOSIS — I10 Essential (primary) hypertension: Secondary | ICD-10-CM | POA: Diagnosis not present

## 2020-10-20 DIAGNOSIS — J9601 Acute respiratory failure with hypoxia: Secondary | ICD-10-CM | POA: Diagnosis not present

## 2020-10-20 DIAGNOSIS — A419 Sepsis, unspecified organism: Secondary | ICD-10-CM | POA: Diagnosis not present

## 2020-10-20 DIAGNOSIS — T380X5D Adverse effect of glucocorticoids and synthetic analogues, subsequent encounter: Secondary | ICD-10-CM | POA: Diagnosis not present

## 2020-10-20 DIAGNOSIS — U071 COVID-19: Secondary | ICD-10-CM | POA: Diagnosis not present

## 2020-10-26 DIAGNOSIS — T380X5D Adverse effect of glucocorticoids and synthetic analogues, subsequent encounter: Secondary | ICD-10-CM | POA: Diagnosis not present

## 2020-10-26 DIAGNOSIS — E785 Hyperlipidemia, unspecified: Secondary | ICD-10-CM | POA: Diagnosis not present

## 2020-10-26 DIAGNOSIS — U071 COVID-19: Secondary | ICD-10-CM | POA: Diagnosis not present

## 2020-10-26 DIAGNOSIS — E1165 Type 2 diabetes mellitus with hyperglycemia: Secondary | ICD-10-CM | POA: Diagnosis not present

## 2020-10-26 DIAGNOSIS — A419 Sepsis, unspecified organism: Secondary | ICD-10-CM | POA: Diagnosis not present

## 2020-10-26 DIAGNOSIS — J1282 Pneumonia due to coronavirus disease 2019: Secondary | ICD-10-CM | POA: Diagnosis not present

## 2020-10-26 DIAGNOSIS — J9601 Acute respiratory failure with hypoxia: Secondary | ICD-10-CM | POA: Diagnosis not present

## 2020-10-26 DIAGNOSIS — I480 Paroxysmal atrial fibrillation: Secondary | ICD-10-CM | POA: Diagnosis not present

## 2020-10-26 DIAGNOSIS — I10 Essential (primary) hypertension: Secondary | ICD-10-CM | POA: Diagnosis not present

## 2020-11-02 DIAGNOSIS — R809 Proteinuria, unspecified: Secondary | ICD-10-CM | POA: Diagnosis not present

## 2020-11-02 DIAGNOSIS — I10 Essential (primary) hypertension: Secondary | ICD-10-CM | POA: Diagnosis not present

## 2020-11-02 DIAGNOSIS — Z6841 Body Mass Index (BMI) 40.0 and over, adult: Secondary | ICD-10-CM | POA: Diagnosis not present

## 2020-11-02 DIAGNOSIS — E1129 Type 2 diabetes mellitus with other diabetic kidney complication: Secondary | ICD-10-CM | POA: Diagnosis not present

## 2020-11-02 DIAGNOSIS — E785 Hyperlipidemia, unspecified: Secondary | ICD-10-CM | POA: Diagnosis not present

## 2021-03-07 DIAGNOSIS — E11319 Type 2 diabetes mellitus with unspecified diabetic retinopathy without macular edema: Secondary | ICD-10-CM | POA: Diagnosis not present

## 2021-03-07 DIAGNOSIS — E785 Hyperlipidemia, unspecified: Secondary | ICD-10-CM | POA: Diagnosis not present

## 2021-03-07 DIAGNOSIS — I10 Essential (primary) hypertension: Secondary | ICD-10-CM | POA: Diagnosis not present

## 2021-03-07 DIAGNOSIS — E1129 Type 2 diabetes mellitus with other diabetic kidney complication: Secondary | ICD-10-CM | POA: Diagnosis not present

## 2021-03-07 DIAGNOSIS — I4891 Unspecified atrial fibrillation: Secondary | ICD-10-CM | POA: Diagnosis not present

## 2021-04-15 ENCOUNTER — Ambulatory Visit: Payer: Medicare HMO | Admitting: Podiatry

## 2021-04-19 ENCOUNTER — Ambulatory Visit: Payer: Medicare HMO | Admitting: Podiatry

## 2021-04-19 ENCOUNTER — Other Ambulatory Visit: Payer: Self-pay

## 2021-04-19 DIAGNOSIS — M79675 Pain in left toe(s): Secondary | ICD-10-CM | POA: Diagnosis not present

## 2021-04-19 DIAGNOSIS — M79674 Pain in right toe(s): Secondary | ICD-10-CM | POA: Diagnosis not present

## 2021-04-19 DIAGNOSIS — B351 Tinea unguium: Secondary | ICD-10-CM

## 2021-04-19 DIAGNOSIS — E119 Type 2 diabetes mellitus without complications: Secondary | ICD-10-CM

## 2021-04-19 DIAGNOSIS — L97511 Non-pressure chronic ulcer of other part of right foot limited to breakdown of skin: Secondary | ICD-10-CM | POA: Diagnosis not present

## 2021-04-19 MED ORDER — MUPIROCIN 2 % EX OINT: 1.0000 "application " | TOPICAL_OINTMENT | Freq: Two times a day (BID) | CUTANEOUS | 2 refills | Status: AC

## 2021-04-19 MED ORDER — CICLOPIROX 8 % EX SOLN: Freq: Every day | CUTANEOUS | 2 refills | Status: AC

## 2021-04-19 MED ORDER — CEPHALEXIN 500 MG PO CAPS: 500.0000 mg | ORAL_CAPSULE | Freq: Three times a day (TID) | ORAL | 0 refills | Status: AC

## 2021-04-19 NOTE — Patient Instructions (Signed)
Start the cephalexin, the antibiotic orally for the right third toe.  Also prescribed mupirocin ointment to apply to the right third toe twice a day.  Watch out for any signs of infection including increased swelling, redness, drainage or any fever/chills.  There is any issues to let me know immediately.  Ciclopirox, Penlac, so the nail fungus to apply daily to the nails.  Once a week he need to file the nails with a emery board and cleaned with rubbing alcohol before reapplying.  Ciclopirox nail solution What is this medication? CICLOPIROX (sye kloe PEER ox) NAIL SOLUTION is an antifungal medicine. It used to treat fungal infections of the nails. This medicine may be used for other purposes; ask your health care provider or pharmacist if you have questions. COMMON BRAND NAME(S): Ciclodan Nail Solution, CNL8, Penlac What should I tell my care team before I take this medication? They need to know if you have any of these conditions: diabetes mellitus history of seizures HIV infection immune system problems or organ transplant large areas of burned or damaged skin peripheral vascular disease or poor circulation taking corticosteroid medication (including steroid inhalers, cream, or lotion) an unusual or allergic reaction to ciclopirox, isopropyl alcohol, other medicines, foods, dyes, or preservatives pregnant or trying to get pregnant breast-feeding How should I use this medication? This medicine is for external use only. Follow the directions that come with this medicine exactly. Wash and dry your hands before use. Avoid contact with the eyes, mouth or nose. If you do get this medicine in your eyes, rinse out with plenty of cool tap water. Contact your doctor or health care professional if eye irritation occurs. Use at regular intervals. Do not use your medicine more often than directed. Finish the full course prescribed by your doctor or health care professional even if you think you are  better. Do not stop using except on your doctor's advice. Talk to your pediatrician regarding the use of this medicine in children. While this medicine may be prescribed for children as young as 12 years for selected conditions, precautions do apply. Overdosage: If you think you have taken too much of this medicine contact a poison control center or emergency room at once. NOTE: This medicine is only for you. Do not share this medicine with others. What if I miss a dose? If you miss a dose, use it as soon as you can. If it is almost time for your next dose, use only that dose. Do not use double or extra doses. What may interact with this medication? Interactions are not expected. Do not use any other skin products without telling your doctor or health care professional. This list may not describe all possible interactions. Give your health care provider a list of all the medicines, herbs, non-prescription drugs, or dietary supplements you use. Also tell them if you smoke, drink alcohol, or use illegal drugs. Some items may interact with your medicine. What should I watch for while using this medication? Tell your doctor or health care professional if your symptoms get worse. Four to six months of treatment may be needed for the nail(s) to improve. Some people may not achieve a complete cure or clearing of the nails by this time. Tell your doctor or health care professional if you develop sores or blisters that do not heal properly. If your nail infection returns after stopping using this product, contact your doctor or health care professional. What side effects may I notice from receiving this medication?  Side effects that you should report to your doctor or health care professional as soon as possible: allergic reactions like skin rash, itching or hives, swelling of the face, lips, or tongue severe irritation, redness, burning, blistering, peeling, swelling, oozing Side effects that usually do not  require medical attention (report to your doctor or health care professional if they continue or are bothersome): mild reddening of the skin nail discoloration temporary burning or mild stinging at the site of application This list may not describe all possible side effects. Call your doctor for medical advice about side effects. You may report side effects to FDA at 1-800-FDA-1088. Where should I keep my medication? Keep out of the reach of children. Store at room temperature between 15 and 30 degrees C (59 and 86 degrees F). Do not freeze. Protect from light by storing the bottle in the carton after every use. This medicine is flammable. Keep away from heat and flame. Throw away any unused medicine after the expiration date. NOTE: This sheet is a summary. It may not cover all possible information. If you have questions about this medicine, talk to your doctor, pharmacist, or health care provider.  2022 Elsevier/Gold Standard (2007-08-28 00:00:00)

## 2021-04-22 NOTE — Progress Notes (Signed)
Subjective:   Patient ID: Carrie Moses, female   DOB: 80 y.o.   MRN: [id number]   HPI 80 year old female presents the office today for diabetic foot exam as well as for thick elongated toenails she cannot trim her self.  She is the nails curled under.  In particular she gets pain to her right third digit toe from the nails curved.  She has not seen any drainage or pus or any open sores. Last A1c was 5.9 her last sugar she reports was 64.    Review of Systems  All other systems reviewed and are negative.  Past Medical History:  Diagnosis Date   Diabetes mellitus without complication ([place])    Hypercholesteremia    Hypertension     Past Surgical History:  Procedure Laterality Date   BREAST BIOPSY Right pt unsure   benign     Current Outpatient Medications:    cephALEXin (KEFLEX) 500 MG capsule, Take 1 capsule (500 mg total) by mouth 3 (three) times daily., Disp: 21 capsule, Rfl: 0   ciclopirox (PENLAC) 8 % solution, Apply topically at bedtime. Apply over nail and surrounding skin. Apply daily over previous coat. After seven (7) days, may remove with alcohol and continue cycle., Disp: 6.6 mL, Rfl: 2   mupirocin ointment (BACTROBAN) 2 %, Apply 1 application topically 2 (two) times daily., Disp: 30 g, Rfl: 2   albuterol (VENTOLIN HFA) 108 (90 Base) MCG/ACT inhaler, Inhale 2 puffs into the lungs every 6 (six) hours as needed for wheezing or shortness of breath., Disp: 18 g, Rfl: 0   Alcohol Swabs (DROPSAFE ALCOHOL PREP) 70 % PADS, , Disp: , Rfl:    amLODipine (NORVASC) 5 MG tablet, Take 5 mg by mouth daily., Disp: , Rfl:    apixaban (ELIQUIS) 5 MG TABS tablet, Take 1 tablet (5 mg total) by mouth 2 (two) times daily., Disp: 60 tablet, Rfl: 0   benzonatate (TESSALON PERLES) 100 MG capsule, Take 1 capsule (100 mg total) by mouth 3 (three) times daily as needed for cough., Disp: 30 capsule, Rfl: 0   glipiZIDE-metformin (METAGLIP) 5-500 MG tablet, Take 2 tablets by mouth 2 (two) times  daily., Disp: , Rfl:    losartan (COZAAR) 100 MG tablet, Take 100 mg by mouth daily., Disp: , Rfl:    metoprolol tartrate (LOPRESSOR) 25 MG tablet, Take 1/2 tablet (12.5 mg total) by mouth two times daily., Disp: 60 tablet, Rfl: 0   pioglitazone (ACTOS) 15 MG tablet, Take 15 mg by mouth daily., Disp: , Rfl:    pravastatin (PRAVACHOL) 10 MG tablet, Take 10 mg by mouth at bedtime., Disp: , Rfl:    predniSONE (DELTASONE) 10 MG tablet, Take 4 tablets (40 mg total) daily for 1 day, 3 tabs (30 mg total) daily for 1 day, 2 tabs (20 mg total) daily for 1 days, 1 tab (10 mg) daily for 1 day, then stop, Disp: 10 tablet, Rfl: 0  Allergies  Allergen Reactions   Other Other (See Comments)    Bell peppers cause nausea and vomiting         Objective:  Physical Exam  General: AAO x3, NAD  Dermatological: Nails are hypertrophic, dystrophic, brittle, discolored, elongated 10. No surrounding redness or drainage. Tenderness nails 1-5 bilaterally.  In particular on the right third toe nail is very curved causing wound on the distal portion of toe with a nail stuck in the skin.  There is no exposed bone or tendon.  Localized erythema without any ascending  cellulitis.  No fluctuance or crepitation.  No open lesions or pre-ulcerative lesions are identified today.   Vascular: Dorsalis Pedis artery and Posterior Tibial artery pedal pulses are palpable bilateral with immedate capillary fill time. There is no pain with calf compression, swelling, warmth, erythema.   Neruologic: Grossly intact via light touch bilateral.   Musculoskeletal: Aside from the toes and toenails there is no other areas of discomfort noted.       Assessment:   Symptomatic onychomycosis, wound right third toe from elongated curved toenail     Plan:  -Treatment options discussed including all alternatives, risks, and complications -Etiology of symptoms were discussed -Nails debrided 10 without complications however probably to the  third digit toenail that did cause a wound for the nail is diving in the skin. -Prescribed cephalexin given the wound as well as mupirocin ointment to be applied twice a day. -Prescribed Penlac for the nail fungus as she was to treat this we discussed the success rates, side effects. -Daily foot inspection   Return in about 2 weeks (around 05/03/2021) for right 3rd toe infection.  Celesta Gentile, DPM

## 2021-04-29 ENCOUNTER — Ambulatory Visit: Payer: Self-pay | Admitting: Sports Medicine

## 2021-05-03 ENCOUNTER — Ambulatory Visit: Payer: Medicare HMO | Admitting: Podiatry

## 2021-05-03 ENCOUNTER — Other Ambulatory Visit: Payer: Self-pay

## 2021-05-03 DIAGNOSIS — L97511 Non-pressure chronic ulcer of other part of right foot limited to breakdown of skin: Secondary | ICD-10-CM | POA: Diagnosis not present

## 2021-05-08 NOTE — Progress Notes (Signed)
Subjective: 81 year old female presents today for follow evaluation of a wound to the right third toe from where her toenail was digging into the skin.  She has been using mupirocin ointment and she has not seen any swelling or redness or any drainage and overall doing better and she thinks the wound is healed.  Denies any fevers or chills.  No other concerns.  Objective: AAO x3, NAD DP/PT pulses palpable bilaterally, CRT less than 3 seconds The wound that was present on the right third toe is healed.  There is no open lesions bilaterally.  No edema, erythema.  No areas of drainage. No pain with calf compression, swelling, warmth, erythema  Assessment: Healed wound right third toe  Plan: -All treatment options discussed with the patient including all alternatives, risks, complications.  -The wound is healed.  Discussed daily foot inspection monitor for any reoccurrence.  Like to get on a regular schedule for routine care to help avoid any future issues. -Patient encouraged to call the office with any questions, concerns, change in symptoms.   Trula Slade DPM

## 2021-05-17 ENCOUNTER — Other Ambulatory Visit: Payer: Self-pay | Admitting: Family Medicine

## 2021-05-17 DIAGNOSIS — Z1231 Encounter for screening mammogram for malignant neoplasm of breast: Secondary | ICD-10-CM

## 2021-07-05 ENCOUNTER — Encounter: Payer: Self-pay | Admitting: Podiatry

## 2021-07-05 ENCOUNTER — Other Ambulatory Visit: Payer: Self-pay

## 2021-07-05 ENCOUNTER — Ambulatory Visit: Payer: Medicare HMO | Admitting: Podiatry

## 2021-07-05 DIAGNOSIS — E119 Type 2 diabetes mellitus without complications: Secondary | ICD-10-CM

## 2021-07-05 DIAGNOSIS — M79675 Pain in left toe(s): Secondary | ICD-10-CM | POA: Diagnosis not present

## 2021-07-05 DIAGNOSIS — M79674 Pain in right toe(s): Secondary | ICD-10-CM

## 2021-07-05 NOTE — Progress Notes (Signed)
This patient returns to my office for at risk foot care.  This patient requires this care by a professional since this patient will be at risk due to having diabetes.    This patient is unable to cut nails herself since the patient cannot reach her nails.These nails are painful walking and wearing shoes.  This patient presents for at risk foot care today. Patient presents in a wheelchair.  General Appearance  Alert, conversant and in no acute stress.  Vascular  Dorsalis pedis and posterior tibial  pulses are weakly palpable  bilaterally.  Capillary return is within normal limits  bilaterally. Temperature is within normal limits  bilaterally.  Neurologic  Senn-Weinstein monofilament wire test within normal limits  bilaterally. Muscle power within normal limits bilaterally.  Nails Thick disfigured discolored nails with subungual debris  from hallux to fifth toes bilaterally. No evidence of bacterial infection or drainage bilaterally.  Orthopedic  No limitations of motion  feet .  No crepitus or effusions noted.  No bony pathology or digital deformities noted.  Skin  normotropic skin with no porokeratosis noted bilaterally.  No signs of infections or ulcers noted.     Onychomycosis  Pain in right toes  Pain in left toes  Consent was obtained for treatment procedures.   Mechanical debridement of nails 1-5  bilaterally performed with a nail nipper.  Filed with dremel without incident.    Return office visit    3 months                 Told patient to return for periodic foot care and evaluation due to potential at risk complications.   Rashun Grattan DPM   

## 2021-07-06 ENCOUNTER — Ambulatory Visit
Admission: RE | Admit: 2021-07-06 | Discharge: 2021-07-06 | Disposition: A | Discharge status: Home / Self Care | Payer: Medicare HMO | Source: Ambulatory Visit | Attending: Family Medicine | Admitting: Family Medicine

## 2021-07-06 DIAGNOSIS — Z1231 Encounter for screening mammogram for malignant neoplasm of breast: Secondary | ICD-10-CM

## 2021-07-07 DIAGNOSIS — G8191 Hemiplegia, unspecified affecting right dominant side: Secondary | ICD-10-CM | POA: Diagnosis not present

## 2021-07-07 DIAGNOSIS — Z1331 Encounter for screening for depression: Secondary | ICD-10-CM | POA: Diagnosis not present

## 2021-07-07 DIAGNOSIS — Z6841 Body Mass Index (BMI) 40.0 and over, adult: Secondary | ICD-10-CM | POA: Diagnosis not present

## 2021-07-07 DIAGNOSIS — E11319 Type 2 diabetes mellitus with unspecified diabetic retinopathy without macular edema: Secondary | ICD-10-CM | POA: Diagnosis not present

## 2021-07-07 DIAGNOSIS — E785 Hyperlipidemia, unspecified: Secondary | ICD-10-CM | POA: Diagnosis not present

## 2021-07-07 DIAGNOSIS — I10 Essential (primary) hypertension: Secondary | ICD-10-CM | POA: Diagnosis not present

## 2021-07-07 DIAGNOSIS — I4891 Unspecified atrial fibrillation: Secondary | ICD-10-CM | POA: Diagnosis not present

## 2021-09-13 DIAGNOSIS — I639 Cerebral infarction, unspecified: Secondary | ICD-10-CM | POA: Diagnosis not present

## 2021-09-13 DIAGNOSIS — D3131 Benign neoplasm of right choroid: Secondary | ICD-10-CM | POA: Diagnosis not present

## 2021-09-13 DIAGNOSIS — H35371 Puckering of macula, right eye: Secondary | ICD-10-CM | POA: Diagnosis not present

## 2021-09-13 DIAGNOSIS — Z961 Presence of intraocular lens: Secondary | ICD-10-CM | POA: Diagnosis not present

## 2021-09-13 DIAGNOSIS — H31091 Other chorioretinal scars, right eye: Secondary | ICD-10-CM | POA: Diagnosis not present

## 2021-09-13 DIAGNOSIS — E113291 Type 2 diabetes mellitus with mild nonproliferative diabetic retinopathy without macular edema, right eye: Secondary | ICD-10-CM | POA: Diagnosis not present

## 2021-10-04 ENCOUNTER — Ambulatory Visit: Payer: Medicare PPO | Admitting: Podiatry

## 2021-10-04 ENCOUNTER — Encounter: Payer: Self-pay | Admitting: Podiatry

## 2021-10-04 DIAGNOSIS — M79675 Pain in left toe(s): Secondary | ICD-10-CM

## 2021-10-04 DIAGNOSIS — B351 Tinea unguium: Secondary | ICD-10-CM | POA: Diagnosis not present

## 2021-10-04 DIAGNOSIS — E1169 Type 2 diabetes mellitus with other specified complication: Secondary | ICD-10-CM | POA: Diagnosis not present

## 2021-10-04 DIAGNOSIS — M79674 Pain in right toe(s): Secondary | ICD-10-CM

## 2021-10-04 NOTE — Progress Notes (Signed)
This patient returns to my office for at risk foot care.  This patient requires this care by a professional since this patient will be at risk due to having diabetes.    This patient is unable to cut nails herself since the patient cannot reach her nails.These nails are painful walking and wearing shoes.  This patient presents for at risk foot care today. Patient presents in a wheelchair.  General Appearance  Alert, conversant and in no acute stress.  Vascular  Dorsalis pedis and posterior tibial  pulses are weakly palpable  bilaterally.  Capillary return is within normal limits  bilaterally. Temperature is within normal limits  bilaterally.  Neurologic  Senn-Weinstein monofilament wire test within normal limits  bilaterally. Muscle power within normal limits bilaterally.  Nails Thick disfigured discolored nails with subungual debris  from hallux to fifth toes bilaterally. No evidence of bacterial infection or drainage bilaterally.  Orthopedic  No limitations of motion  feet .  No crepitus or effusions noted.  No bony pathology or digital deformities noted.  Skin  normotropic skin with no porokeratosis noted bilaterally.  No signs of infections or ulcers noted.     Onychomycosis  Pain in right toes  Pain in left toes  Consent was obtained for treatment procedures.   Mechanical debridement of nails 1-5  bilaterally performed with a nail nipper.  Filed with dremel without incident.    Return office visit    3 months                 Told patient to return for periodic foot care and evaluation due to potential at risk complications.   Rhett Najera DPM   

## 2021-12-12 DIAGNOSIS — R195 Other fecal abnormalities: Secondary | ICD-10-CM | POA: Diagnosis not present

## 2021-12-12 DIAGNOSIS — E785 Hyperlipidemia, unspecified: Secondary | ICD-10-CM | POA: Diagnosis not present

## 2021-12-12 DIAGNOSIS — G8191 Hemiplegia, unspecified affecting right dominant side: Secondary | ICD-10-CM | POA: Diagnosis not present

## 2021-12-12 DIAGNOSIS — Z79899 Other long term (current) drug therapy: Secondary | ICD-10-CM | POA: Diagnosis not present

## 2021-12-12 DIAGNOSIS — Z139 Encounter for screening, unspecified: Secondary | ICD-10-CM | POA: Diagnosis not present

## 2021-12-12 DIAGNOSIS — E11319 Type 2 diabetes mellitus with unspecified diabetic retinopathy without macular edema: Secondary | ICD-10-CM | POA: Diagnosis not present

## 2021-12-12 DIAGNOSIS — I10 Essential (primary) hypertension: Secondary | ICD-10-CM | POA: Diagnosis not present

## 2021-12-12 DIAGNOSIS — I4891 Unspecified atrial fibrillation: Secondary | ICD-10-CM | POA: Diagnosis not present

## 2021-12-29 ENCOUNTER — Encounter: Payer: Self-pay | Admitting: Podiatry

## 2022-01-03 ENCOUNTER — Ambulatory Visit: Payer: Medicare PPO | Admitting: Podiatry

## 2022-02-08 ENCOUNTER — Encounter: Payer: Self-pay | Admitting: Podiatry

## 2022-02-08 ENCOUNTER — Ambulatory Visit: Payer: Medicare PPO | Admitting: Podiatry

## 2022-02-08 DIAGNOSIS — B351 Tinea unguium: Secondary | ICD-10-CM

## 2022-02-08 DIAGNOSIS — M79674 Pain in right toe(s): Secondary | ICD-10-CM | POA: Diagnosis not present

## 2022-02-08 DIAGNOSIS — E1169 Type 2 diabetes mellitus with other specified complication: Secondary | ICD-10-CM | POA: Diagnosis not present

## 2022-02-08 DIAGNOSIS — M79675 Pain in left toe(s): Secondary | ICD-10-CM

## 2022-02-08 NOTE — Progress Notes (Signed)
This patient returns to my office for at risk foot care.  This patient requires this care by a professional since this patient will be at risk due to having diabetes.    This patient is unable to cut nails herself since the patient cannot reach her nails.These nails are painful walking and wearing shoes.  This patient presents for at risk foot care today. Patient presents in a wheelchair.  General Appearance  Alert, conversant and in no acute stress.  Vascular  Dorsalis pedis and posterior tibial  pulses are weakly palpable  bilaterally.  Capillary return is within normal limits  bilaterally. Temperature is within normal limits  bilaterally.  Neurologic  Senn-Weinstein monofilament wire test within normal limits  bilaterally. Muscle power within normal limits bilaterally.  Nails Thick disfigured discolored nails with subungual debris  from hallux to fifth toes bilaterally. No evidence of bacterial infection or drainage bilaterally.  Orthopedic  No limitations of motion  feet .  No crepitus or effusions noted.  No bony pathology or digital deformities noted.  Skin  normotropic skin with no porokeratosis noted bilaterally.  No signs of infections or ulcers noted.     Onychomycosis  Pain in right toes  Pain in left toes  Consent was obtained for treatment procedures.   Mechanical debridement of nails 1-5  bilaterally performed with a nail nipper.  Filed with dremel without incident.    Return office visit    3 months                 Told patient to return for periodic foot care and evaluation due to potential at risk complications.   Richar Dunklee DPM   

## 2022-04-28 DIAGNOSIS — G8191 Hemiplegia, unspecified affecting right dominant side: Secondary | ICD-10-CM | POA: Diagnosis not present

## 2022-04-28 DIAGNOSIS — I4891 Unspecified atrial fibrillation: Secondary | ICD-10-CM | POA: Diagnosis not present

## 2022-04-28 DIAGNOSIS — E785 Hyperlipidemia, unspecified: Secondary | ICD-10-CM | POA: Diagnosis not present

## 2022-04-28 DIAGNOSIS — I10 Essential (primary) hypertension: Secondary | ICD-10-CM | POA: Diagnosis not present

## 2022-04-28 DIAGNOSIS — Z9181 History of falling: Secondary | ICD-10-CM | POA: Diagnosis not present

## 2022-04-28 DIAGNOSIS — E11319 Type 2 diabetes mellitus with unspecified diabetic retinopathy without macular edema: Secondary | ICD-10-CM | POA: Diagnosis not present

## 2022-04-28 DIAGNOSIS — Z6838 Body mass index (BMI) 38.0-38.9, adult: Secondary | ICD-10-CM | POA: Diagnosis not present

## 2022-05-17 ENCOUNTER — Ambulatory Visit: Payer: Medicare HMO | Admitting: Podiatry

## 2022-05-17 ENCOUNTER — Encounter: Payer: Self-pay | Admitting: Podiatry

## 2022-05-17 DIAGNOSIS — B351 Tinea unguium: Secondary | ICD-10-CM | POA: Diagnosis not present

## 2022-05-17 DIAGNOSIS — M79675 Pain in left toe(s): Secondary | ICD-10-CM

## 2022-05-17 DIAGNOSIS — E1169 Type 2 diabetes mellitus with other specified complication: Secondary | ICD-10-CM | POA: Diagnosis not present

## 2022-05-17 DIAGNOSIS — M79674 Pain in right toe(s): Secondary | ICD-10-CM

## 2022-05-17 NOTE — Progress Notes (Signed)
This patient returns to my office for at risk foot care.  This patient requires this care by a professional since this patient will be at risk due to having diabetes.    This patient is unable to cut nails herself since the patient cannot reach her nails.These nails are painful walking and wearing shoes.  This patient presents for at risk foot care today. Patient presents in a wheelchair.  General Appearance  Alert, conversant and in no acute stress.  Vascular  Dorsalis pedis and posterior tibial  pulses are weakly palpable  bilaterally.  Capillary return is within normal limits  bilaterally. Temperature is within normal limits  bilaterally.  Neurologic  Senn-Weinstein monofilament wire test within normal limits  bilaterally. Muscle power within normal limits bilaterally.  Nails Thick disfigured discolored nails with subungual debris  from hallux to fifth toes bilaterally. No evidence of bacterial infection or drainage bilaterally.  Orthopedic  No limitations of motion  feet .  No crepitus or effusions noted.  No bony pathology or digital deformities noted.  Skin  normotropic skin with no porokeratosis noted bilaterally.  No signs of infections or ulcers noted.     Onychomycosis  Pain in right toes  Pain in left toes  Consent was obtained for treatment procedures.   Mechanical debridement of nails 1-5  bilaterally performed with a nail nipper.  Filed with dremel without incident.    Return office visit    3 months                 Told patient to return for periodic foot care and evaluation due to potential at risk complications.   Gardiner Barefoot DPM

## 2022-05-24 DIAGNOSIS — R809 Proteinuria, unspecified: Secondary | ICD-10-CM | POA: Diagnosis not present

## 2022-05-24 DIAGNOSIS — I4891 Unspecified atrial fibrillation: Secondary | ICD-10-CM | POA: Diagnosis not present

## 2022-05-24 DIAGNOSIS — E1129 Type 2 diabetes mellitus with other diabetic kidney complication: Secondary | ICD-10-CM | POA: Diagnosis not present

## 2022-06-26 DIAGNOSIS — M810 Age-related osteoporosis without current pathological fracture: Secondary | ICD-10-CM | POA: Diagnosis not present

## 2022-06-26 DIAGNOSIS — M545 Low back pain, unspecified: Secondary | ICD-10-CM | POA: Diagnosis not present

## 2022-06-27 ENCOUNTER — Other Ambulatory Visit: Payer: Self-pay | Admitting: Family Medicine

## 2022-06-27 DIAGNOSIS — M810 Age-related osteoporosis without current pathological fracture: Secondary | ICD-10-CM

## 2022-07-28 ENCOUNTER — Other Ambulatory Visit: Payer: Self-pay | Admitting: Family Medicine

## 2022-07-28 DIAGNOSIS — Z1231 Encounter for screening mammogram for malignant neoplasm of breast: Secondary | ICD-10-CM

## 2022-08-08 ENCOUNTER — Ambulatory Visit
Admission: RE | Admit: 2022-08-08 | Discharge: 2022-08-08 | Disposition: A | Discharge status: Home / Self Care | Payer: Medicare HMO | Source: Ambulatory Visit | Attending: Family Medicine | Admitting: Family Medicine

## 2022-08-08 DIAGNOSIS — Z1231 Encounter for screening mammogram for malignant neoplasm of breast: Secondary | ICD-10-CM | POA: Diagnosis not present

## 2022-08-10 ENCOUNTER — Other Ambulatory Visit: Payer: Self-pay | Admitting: Family Medicine

## 2022-08-10 DIAGNOSIS — R928 Other abnormal and inconclusive findings on diagnostic imaging of breast: Secondary | ICD-10-CM

## 2022-08-22 ENCOUNTER — Ambulatory Visit: Payer: Medicare HMO | Admitting: Podiatry

## 2022-08-22 ENCOUNTER — Encounter: Payer: Self-pay | Admitting: Podiatry

## 2022-08-22 DIAGNOSIS — M79675 Pain in left toe(s): Secondary | ICD-10-CM

## 2022-08-22 DIAGNOSIS — E1169 Type 2 diabetes mellitus with other specified complication: Secondary | ICD-10-CM

## 2022-08-22 DIAGNOSIS — M79674 Pain in right toe(s): Secondary | ICD-10-CM

## 2022-08-22 DIAGNOSIS — B351 Tinea unguium: Secondary | ICD-10-CM

## 2022-08-22 NOTE — Progress Notes (Signed)
This patient returns to my office for at risk foot care.  This patient requires this care by a professional since this patient will be at risk due to having diabetes.    This patient is unable to cut nails herself since the patient cannot reach her nails.These nails are painful walking and wearing shoes.  This patient presents for at risk foot care today. Patient presents in a wheelchair. ? ?General Appearance  Alert, conversant and in no acute stress. ? ?Vascular  Dorsalis pedis and posterior tibial  pulses are weakly palpable  bilaterally.  Capillary return is within normal limits  bilaterally. Temperature is within normal limits  bilaterally. ? ?Neurologic  Senn-Weinstein monofilament wire test within normal limits  bilaterally. Muscle power within normal limits bilaterally. ? ?Nails Thick disfigured discolored nails with subungual debris  from hallux to fifth toes bilaterally. No evidence of bacterial infection or drainage bilaterally. ? ?Orthopedic  No limitations of motion  feet .  No crepitus or effusions noted.  No bony pathology or digital deformities noted. ? ?Skin  normotropic skin with no porokeratosis noted bilaterally.  No signs of infections or ulcers noted.    ? ?Onychomycosis  Pain in right toes  Pain in left toes ? ?Consent was obtained for treatment procedures.   Mechanical debridement of nails 1-5  bilaterally performed with a nail nipper.  Filed with dremel without incident.  ? ? ?Return office visit    3 months                 Told patient to return for periodic foot care and evaluation due to potential at risk complications. ? ? ?Maxfield Gildersleeve DPM   ?

## 2022-09-01 DIAGNOSIS — I4891 Unspecified atrial fibrillation: Secondary | ICD-10-CM | POA: Diagnosis not present

## 2022-09-01 DIAGNOSIS — Z1331 Encounter for screening for depression: Secondary | ICD-10-CM | POA: Diagnosis not present

## 2022-09-01 DIAGNOSIS — E785 Hyperlipidemia, unspecified: Secondary | ICD-10-CM | POA: Diagnosis not present

## 2022-09-01 DIAGNOSIS — Z79899 Other long term (current) drug therapy: Secondary | ICD-10-CM | POA: Diagnosis not present

## 2022-09-01 DIAGNOSIS — I10 Essential (primary) hypertension: Secondary | ICD-10-CM | POA: Diagnosis not present

## 2022-09-01 DIAGNOSIS — G8191 Hemiplegia, unspecified affecting right dominant side: Secondary | ICD-10-CM | POA: Diagnosis not present

## 2022-09-01 DIAGNOSIS — R8290 Unspecified abnormal findings in urine: Secondary | ICD-10-CM | POA: Diagnosis not present

## 2022-09-01 DIAGNOSIS — E11319 Type 2 diabetes mellitus with unspecified diabetic retinopathy without macular edema: Secondary | ICD-10-CM | POA: Diagnosis not present

## 2022-09-06 ENCOUNTER — Ambulatory Visit
Admission: RE | Admit: 2022-09-06 | Discharge: 2022-09-06 | Disposition: A | Discharge status: Home / Self Care | Payer: Medicare HMO | Source: Ambulatory Visit | Attending: Family Medicine | Admitting: Family Medicine

## 2022-09-06 ENCOUNTER — Ambulatory Visit: Payer: Medicare HMO

## 2022-09-06 DIAGNOSIS — R928 Other abnormal and inconclusive findings on diagnostic imaging of breast: Secondary | ICD-10-CM

## 2022-09-06 DIAGNOSIS — R922 Inconclusive mammogram: Secondary | ICD-10-CM | POA: Diagnosis not present

## 2022-09-20 DIAGNOSIS — Z961 Presence of intraocular lens: Secondary | ICD-10-CM | POA: Diagnosis not present

## 2022-09-20 DIAGNOSIS — D3131 Benign neoplasm of right choroid: Secondary | ICD-10-CM | POA: Diagnosis not present

## 2022-09-20 DIAGNOSIS — H35371 Puckering of macula, right eye: Secondary | ICD-10-CM | POA: Diagnosis not present

## 2022-09-20 DIAGNOSIS — E113291 Type 2 diabetes mellitus with mild nonproliferative diabetic retinopathy without macular edema, right eye: Secondary | ICD-10-CM | POA: Diagnosis not present

## 2022-09-20 DIAGNOSIS — I639 Cerebral infarction, unspecified: Secondary | ICD-10-CM | POA: Diagnosis not present

## 2022-09-20 DIAGNOSIS — H31091 Other chorioretinal scars, right eye: Secondary | ICD-10-CM | POA: Diagnosis not present

## 2022-10-16 DIAGNOSIS — R062 Wheezing: Secondary | ICD-10-CM | POA: Diagnosis not present

## 2022-10-16 DIAGNOSIS — J020 Streptococcal pharyngitis: Secondary | ICD-10-CM | POA: Diagnosis not present

## 2022-10-16 DIAGNOSIS — R059 Cough, unspecified: Secondary | ICD-10-CM | POA: Diagnosis not present

## 2022-10-16 DIAGNOSIS — E109 Type 1 diabetes mellitus without complications: Secondary | ICD-10-CM | POA: Diagnosis not present

## 2022-10-16 DIAGNOSIS — Z20822 Contact with and (suspected) exposure to covid-19: Secondary | ICD-10-CM | POA: Diagnosis not present

## 2022-11-21 ENCOUNTER — Encounter: Payer: Self-pay | Admitting: Podiatry

## 2022-11-21 ENCOUNTER — Ambulatory Visit: Payer: Medicare HMO | Admitting: Podiatry

## 2022-11-21 DIAGNOSIS — M79675 Pain in left toe(s): Secondary | ICD-10-CM

## 2022-11-21 DIAGNOSIS — M79674 Pain in right toe(s): Secondary | ICD-10-CM | POA: Diagnosis not present

## 2022-11-21 DIAGNOSIS — B351 Tinea unguium: Secondary | ICD-10-CM | POA: Diagnosis not present

## 2022-11-21 DIAGNOSIS — E1169 Type 2 diabetes mellitus with other specified complication: Secondary | ICD-10-CM

## 2022-11-21 NOTE — Progress Notes (Signed)
This patient returns to my office for at risk foot care.  This patient requires this care by a professional since this patient will be at risk due to having diabetes.    This patient is unable to cut nails herself since the patient cannot reach her nails.These nails are painful walking and wearing shoes.  This patient presents for at risk foot care today. Patient presents in a wheelchair. ? ?General Appearance  Alert, conversant and in no acute stress. ? ?Vascular  Dorsalis pedis and posterior tibial  pulses are weakly palpable  bilaterally.  Capillary return is within normal limits  bilaterally. Temperature is within normal limits  bilaterally. ? ?Neurologic  Senn-Weinstein monofilament wire test within normal limits  bilaterally. Muscle power within normal limits bilaterally. ? ?Nails Thick disfigured discolored nails with subungual debris  from hallux to fifth toes bilaterally. No evidence of bacterial infection or drainage bilaterally. ? ?Orthopedic  No limitations of motion  feet .  No crepitus or effusions noted.  No bony pathology or digital deformities noted. ? ?Skin  normotropic skin with no porokeratosis noted bilaterally.  No signs of infections or ulcers noted.    ? ?Onychomycosis  Pain in right toes  Pain in left toes ? ?Consent was obtained for treatment procedures.   Mechanical debridement of nails 1-5  bilaterally performed with a nail nipper.  Filed with dremel without incident.  ? ? ?Return office visit    3 months                 Told patient to return for periodic foot care and evaluation due to potential at risk complications. ? ? ?Gregory Mayer DPM   ?

## 2022-11-29 DIAGNOSIS — Z0000 Encounter for general adult medical examination without abnormal findings: Secondary | ICD-10-CM | POA: Diagnosis not present

## 2022-11-29 DIAGNOSIS — Z1331 Encounter for screening for depression: Secondary | ICD-10-CM | POA: Diagnosis not present

## 2022-11-29 DIAGNOSIS — Z5941 Food insecurity: Secondary | ICD-10-CM | POA: Diagnosis not present

## 2022-11-29 DIAGNOSIS — Z9181 History of falling: Secondary | ICD-10-CM | POA: Diagnosis not present

## 2023-01-05 DIAGNOSIS — E785 Hyperlipidemia, unspecified: Secondary | ICD-10-CM | POA: Diagnosis not present

## 2023-01-05 DIAGNOSIS — R809 Proteinuria, unspecified: Secondary | ICD-10-CM | POA: Diagnosis not present

## 2023-01-05 DIAGNOSIS — Z139 Encounter for screening, unspecified: Secondary | ICD-10-CM | POA: Diagnosis not present

## 2023-01-05 DIAGNOSIS — I10 Essential (primary) hypertension: Secondary | ICD-10-CM | POA: Diagnosis not present

## 2023-01-05 DIAGNOSIS — E1129 Type 2 diabetes mellitus with other diabetic kidney complication: Secondary | ICD-10-CM | POA: Diagnosis not present

## 2023-01-05 DIAGNOSIS — I4891 Unspecified atrial fibrillation: Secondary | ICD-10-CM | POA: Diagnosis not present

## 2023-01-05 DIAGNOSIS — G8191 Hemiplegia, unspecified affecting right dominant side: Secondary | ICD-10-CM | POA: Diagnosis not present

## 2023-02-02 ENCOUNTER — Ambulatory Visit
Admission: RE | Admit: 2023-02-02 | Discharge: 2023-02-02 | Disposition: A | Discharge status: Home / Self Care | Payer: Medicare HMO | Source: Ambulatory Visit | Attending: Family Medicine | Admitting: Family Medicine

## 2023-02-02 DIAGNOSIS — N958 Other specified menopausal and perimenopausal disorders: Secondary | ICD-10-CM | POA: Diagnosis not present

## 2023-02-02 DIAGNOSIS — E349 Endocrine disorder, unspecified: Secondary | ICD-10-CM | POA: Diagnosis not present

## 2023-02-02 DIAGNOSIS — M8588 Other specified disorders of bone density and structure, other site: Secondary | ICD-10-CM | POA: Diagnosis not present

## 2023-02-02 DIAGNOSIS — M810 Age-related osteoporosis without current pathological fracture: Secondary | ICD-10-CM

## 2023-02-07 DIAGNOSIS — Z79899 Other long term (current) drug therapy: Secondary | ICD-10-CM | POA: Diagnosis not present

## 2023-02-22 ENCOUNTER — Encounter: Payer: Self-pay | Admitting: Podiatry

## 2023-02-22 ENCOUNTER — Ambulatory Visit: Payer: Medicare HMO | Admitting: Podiatry

## 2023-02-22 DIAGNOSIS — M79675 Pain in left toe(s): Secondary | ICD-10-CM

## 2023-02-22 DIAGNOSIS — B351 Tinea unguium: Secondary | ICD-10-CM

## 2023-02-22 DIAGNOSIS — M79674 Pain in right toe(s): Secondary | ICD-10-CM

## 2023-02-22 DIAGNOSIS — E1169 Type 2 diabetes mellitus with other specified complication: Secondary | ICD-10-CM

## 2023-02-22 NOTE — Progress Notes (Signed)
This patient returns to my office for at risk foot care.  This patient requires this care by a professional since this patient will be at risk due to having diabetes.    This patient is unable to cut nails herself since the patient cannot reach her nails.These nails are painful walking and wearing shoes.  This patient presents for at risk foot care today. Patient presents in a wheelchair.  General Appearance  Alert, conversant and in no acute stress.  Vascular  Dorsalis pedis and posterior tibial  pulses are weakly palpable  bilaterally.  Capillary return is within normal limits  bilaterally. Temperature is within normal limits  bilaterally.  Neurologic  Senn-Weinstein monofilament wire test within normal limits  bilaterally. Muscle power within normal limits bilaterally.  Nails Thick disfigured discolored nails with subungual debris  from hallux to fifth toes bilaterally. No evidence of bacterial infection or drainage bilaterally.  Orthopedic  No limitations of motion  feet .  No crepitus or effusions noted.  No bony pathology or digital deformities noted.  Skin  normotropic skin with no porokeratosis noted bilaterally.  No signs of infections or ulcers noted.     Onychomycosis  Pain in right toes  Pain in left toes  Consent was obtained for treatment procedures.   Mechanical debridement of nails 1-5  bilaterally performed with a nail nipper.  Filed with dremel without incident.    Return office visit    4  months                 Told patient to return for periodic foot care and evaluation due to potential at risk complications.   Helane Gunther DPM

## 2023-06-07 DIAGNOSIS — E785 Hyperlipidemia, unspecified: Secondary | ICD-10-CM | POA: Diagnosis not present

## 2023-06-07 DIAGNOSIS — Z79899 Other long term (current) drug therapy: Secondary | ICD-10-CM | POA: Diagnosis not present

## 2023-06-07 DIAGNOSIS — I10 Essential (primary) hypertension: Secondary | ICD-10-CM | POA: Diagnosis not present

## 2023-06-07 DIAGNOSIS — I4891 Unspecified atrial fibrillation: Secondary | ICD-10-CM | POA: Diagnosis not present

## 2023-06-07 DIAGNOSIS — E1129 Type 2 diabetes mellitus with other diabetic kidney complication: Secondary | ICD-10-CM | POA: Diagnosis not present

## 2023-06-07 DIAGNOSIS — G8191 Hemiplegia, unspecified affecting right dominant side: Secondary | ICD-10-CM | POA: Diagnosis not present

## 2023-06-07 DIAGNOSIS — R809 Proteinuria, unspecified: Secondary | ICD-10-CM | POA: Diagnosis not present

## 2023-06-25 ENCOUNTER — Ambulatory Visit: Payer: Medicare HMO | Admitting: Podiatry

## 2023-06-25 ENCOUNTER — Encounter: Payer: Self-pay | Admitting: Podiatry

## 2023-06-25 DIAGNOSIS — M79674 Pain in right toe(s): Secondary | ICD-10-CM | POA: Diagnosis not present

## 2023-06-25 DIAGNOSIS — M79675 Pain in left toe(s): Secondary | ICD-10-CM

## 2023-06-25 DIAGNOSIS — E1169 Type 2 diabetes mellitus with other specified complication: Secondary | ICD-10-CM | POA: Diagnosis not present

## 2023-06-25 DIAGNOSIS — B351 Tinea unguium: Secondary | ICD-10-CM

## 2023-06-25 NOTE — Progress Notes (Signed)
This patient returns to my office for at risk foot care.  This patient requires this care by a professional since this patient will be at risk due to having diabetes.    This patient is unable to cut nails herself since the patient cannot reach her nails.These nails are painful walking and wearing shoes.  This patient presents for at risk foot care today. Patient presents in a wheelchair.  General Appearance  Alert, conversant and in no acute stress.  Vascular  Dorsalis pedis and posterior tibial  pulses are weakly palpable  bilaterally.  Capillary return is within normal limits  bilaterally. Temperature is within normal limits  bilaterally.  Neurologic  Senn-Weinstein monofilament wire test within normal limits  bilaterally. Muscle power within normal limits bilaterally.  Nails Thick disfigured discolored nails with subungual debris  from hallux to fifth toes bilaterally. No evidence of bacterial infection or drainage bilaterally.  Orthopedic  No limitations of motion  feet .  No crepitus or effusions noted.  No bony pathology or digital deformities noted.  Skin  normotropic skin with no porokeratosis noted bilaterally.  No signs of infections or ulcers noted.     Onychomycosis  Pain in right toes  Pain in left toes  Consent was obtained for treatment procedures.   Mechanical debridement of nails 1-5  bilaterally performed with a nail nipper.  Filed with dremel without incident.    Return office visit    4  months                 Told patient to return for periodic foot care and evaluation due to potential at risk complications.   Helane Gunther DPM

## 2023-07-04 ENCOUNTER — Other Ambulatory Visit: Payer: Self-pay | Admitting: Family Medicine

## 2023-07-04 DIAGNOSIS — Z1231 Encounter for screening mammogram for malignant neoplasm of breast: Secondary | ICD-10-CM

## 2023-09-11 ENCOUNTER — Ambulatory Visit
Admission: RE | Admit: 2023-09-11 | Discharge: 2023-09-11 | Disposition: A | Discharge status: Home / Self Care | Source: Ambulatory Visit | Attending: Family Medicine | Admitting: Family Medicine

## 2023-09-11 ENCOUNTER — Ambulatory Visit

## 2023-09-11 DIAGNOSIS — Z1231 Encounter for screening mammogram for malignant neoplasm of breast: Secondary | ICD-10-CM

## 2023-09-25 ENCOUNTER — Ambulatory Visit: Admitting: Podiatry

## 2023-10-16 DIAGNOSIS — R809 Proteinuria, unspecified: Secondary | ICD-10-CM | POA: Diagnosis not present

## 2023-10-16 DIAGNOSIS — I10 Essential (primary) hypertension: Secondary | ICD-10-CM | POA: Diagnosis not present

## 2023-10-16 DIAGNOSIS — E785 Hyperlipidemia, unspecified: Secondary | ICD-10-CM | POA: Diagnosis not present

## 2023-10-16 DIAGNOSIS — G8191 Hemiplegia, unspecified affecting right dominant side: Secondary | ICD-10-CM | POA: Diagnosis not present

## 2023-10-16 DIAGNOSIS — E1129 Type 2 diabetes mellitus with other diabetic kidney complication: Secondary | ICD-10-CM | POA: Diagnosis not present

## 2023-10-16 DIAGNOSIS — I4891 Unspecified atrial fibrillation: Secondary | ICD-10-CM | POA: Diagnosis not present

## 2023-10-25 ENCOUNTER — Ambulatory Visit: Admitting: Podiatry

## 2023-11-01 ENCOUNTER — Ambulatory Visit: Admitting: Podiatry

## 2023-11-13 ENCOUNTER — Ambulatory Visit: Admitting: Podiatry

## 2023-11-14 ENCOUNTER — Ambulatory Visit: Admitting: Podiatry

## 2023-11-14 ENCOUNTER — Encounter: Payer: Self-pay | Admitting: Podiatry

## 2023-11-14 DIAGNOSIS — M79675 Pain in left toe(s): Secondary | ICD-10-CM | POA: Diagnosis not present

## 2023-11-14 DIAGNOSIS — E1169 Type 2 diabetes mellitus with other specified complication: Secondary | ICD-10-CM

## 2023-11-14 DIAGNOSIS — B351 Tinea unguium: Secondary | ICD-10-CM | POA: Diagnosis not present

## 2023-11-14 DIAGNOSIS — M79674 Pain in right toe(s): Secondary | ICD-10-CM

## 2023-11-14 NOTE — Progress Notes (Signed)
This patient returns to my office for at risk foot care.  This patient requires this care by a professional since this patient will be at risk due to having diabetes.    This patient is unable to cut nails herself since the patient cannot reach her nails.These nails are painful walking and wearing shoes.  This patient presents for at risk foot care today. Patient presents in a wheelchair.  General Appearance  Alert, conversant and in no acute stress.  Vascular  Dorsalis pedis and posterior tibial  pulses are weakly palpable  bilaterally.  Capillary return is within normal limits  bilaterally. Temperature is within normal limits  bilaterally.  Neurologic  Senn-Weinstein monofilament wire test within normal limits  bilaterally. Muscle power within normal limits bilaterally.  Nails Thick disfigured discolored nails with subungual debris  from hallux to fifth toes bilaterally. No evidence of bacterial infection or drainage bilaterally.  Orthopedic  No limitations of motion  feet .  No crepitus or effusions noted.  No bony pathology or digital deformities noted.  Skin  normotropic skin with no porokeratosis noted bilaterally.  No signs of infections or ulcers noted.     Onychomycosis  Pain in right toes  Pain in left toes  Consent was obtained for treatment procedures.   Mechanical debridement of nails 1-5  bilaterally performed with a nail nipper.  Filed with dremel without incident.    Return office visit    4  months                 Told patient to return for periodic foot care and evaluation due to potential at risk complications.   Helane Gunther DPM

## 2024-01-16 DIAGNOSIS — Z961 Presence of intraocular lens: Secondary | ICD-10-CM | POA: Diagnosis not present

## 2024-01-16 DIAGNOSIS — D3131 Benign neoplasm of right choroid: Secondary | ICD-10-CM | POA: Diagnosis not present

## 2024-01-16 DIAGNOSIS — I639 Cerebral infarction, unspecified: Secondary | ICD-10-CM | POA: Diagnosis not present

## 2024-01-16 DIAGNOSIS — E119 Type 2 diabetes mellitus without complications: Secondary | ICD-10-CM | POA: Diagnosis not present

## 2024-02-14 ENCOUNTER — Encounter: Payer: Self-pay | Admitting: Podiatry

## 2024-02-14 ENCOUNTER — Ambulatory Visit: Admitting: Podiatry

## 2024-02-14 DIAGNOSIS — M79674 Pain in right toe(s): Secondary | ICD-10-CM

## 2024-02-14 DIAGNOSIS — M79675 Pain in left toe(s): Secondary | ICD-10-CM | POA: Diagnosis not present

## 2024-02-14 DIAGNOSIS — B351 Tinea unguium: Secondary | ICD-10-CM | POA: Diagnosis not present

## 2024-02-14 DIAGNOSIS — E1169 Type 2 diabetes mellitus with other specified complication: Secondary | ICD-10-CM

## 2024-02-14 NOTE — Progress Notes (Signed)
This patient returns to my office for at risk foot care.  This patient requires this care by a professional since this patient will be at risk due to having diabetes.    This patient is unable to cut nails herself since the patient cannot reach her nails.These nails are painful walking and wearing shoes.  This patient presents for at risk foot care today. Patient presents in a wheelchair.  General Appearance  Alert, conversant and in no acute stress.  Vascular  Dorsalis pedis and posterior tibial  pulses are weakly palpable  bilaterally.  Capillary return is within normal limits  bilaterally. Temperature is within normal limits  bilaterally.  Neurologic  Senn-Weinstein monofilament wire test within normal limits  bilaterally. Muscle power within normal limits bilaterally.  Nails Thick disfigured discolored nails with subungual debris  from hallux to fifth toes bilaterally. No evidence of bacterial infection or drainage bilaterally.  Orthopedic  No limitations of motion  feet .  No crepitus or effusions noted.  No bony pathology or digital deformities noted.  Skin  normotropic skin with no porokeratosis noted bilaterally.  No signs of infections or ulcers noted.     Onychomycosis  Pain in right toes  Pain in left toes  Consent was obtained for treatment procedures.   Mechanical debridement of nails 1-5  bilaterally performed with a nail nipper.  Filed with dremel without incident.    Return office visit    4  months                 Told patient to return for periodic foot care and evaluation due to potential at risk complications.   Helane Gunther DPM

## 2024-02-19 DIAGNOSIS — R809 Proteinuria, unspecified: Secondary | ICD-10-CM | POA: Diagnosis not present

## 2024-02-19 DIAGNOSIS — E785 Hyperlipidemia, unspecified: Secondary | ICD-10-CM | POA: Diagnosis not present

## 2024-02-19 DIAGNOSIS — G8191 Hemiplegia, unspecified affecting right dominant side: Secondary | ICD-10-CM | POA: Diagnosis not present

## 2024-02-19 DIAGNOSIS — E1129 Type 2 diabetes mellitus with other diabetic kidney complication: Secondary | ICD-10-CM | POA: Diagnosis not present

## 2024-02-19 DIAGNOSIS — I4891 Unspecified atrial fibrillation: Secondary | ICD-10-CM | POA: Diagnosis not present

## 2024-02-19 DIAGNOSIS — I10 Essential (primary) hypertension: Secondary | ICD-10-CM | POA: Diagnosis not present

## 2024-07-14 ENCOUNTER — Ambulatory Visit: Admitting: Podiatry
